# Patient Record
Sex: Female | Born: 1985 | Race: Black or African American | Hispanic: No | Marital: Married | Smoking: Former smoker
Health system: Southern US, Community
[De-identification: ages and names within clinical notes are randomized; demographics above are authoritative.]

## PROBLEM LIST (undated history)

## (undated) DIAGNOSIS — A159 Respiratory tuberculosis unspecified: Secondary | ICD-10-CM

## (undated) DIAGNOSIS — J45909 Unspecified asthma, uncomplicated: Secondary | ICD-10-CM

## (undated) DIAGNOSIS — R112 Nausea with vomiting, unspecified: Secondary | ICD-10-CM

## (undated) DIAGNOSIS — N939 Abnormal uterine and vaginal bleeding, unspecified: Secondary | ICD-10-CM

## (undated) DIAGNOSIS — N390 Urinary tract infection, site not specified: Secondary | ICD-10-CM

## (undated) DIAGNOSIS — E041 Nontoxic single thyroid nodule: Secondary | ICD-10-CM

## (undated) DIAGNOSIS — Z9889 Other specified postprocedural states: Secondary | ICD-10-CM

## (undated) DIAGNOSIS — D649 Anemia, unspecified: Secondary | ICD-10-CM

## (undated) DIAGNOSIS — R0602 Shortness of breath: Secondary | ICD-10-CM

## (undated) DIAGNOSIS — G4733 Obstructive sleep apnea (adult) (pediatric): Secondary | ICD-10-CM

## (undated) DIAGNOSIS — J9801 Acute bronchospasm: Secondary | ICD-10-CM

## (undated) DIAGNOSIS — E66813 Obesity, class 3: Secondary | ICD-10-CM

## (undated) DIAGNOSIS — Z973 Presence of spectacles and contact lenses: Secondary | ICD-10-CM

## (undated) DIAGNOSIS — M75100 Unspecified rotator cuff tear or rupture of unspecified shoulder, not specified as traumatic: Secondary | ICD-10-CM

## (undated) DIAGNOSIS — E785 Hyperlipidemia, unspecified: Secondary | ICD-10-CM

## (undated) DIAGNOSIS — D219 Benign neoplasm of connective and other soft tissue, unspecified: Secondary | ICD-10-CM

## (undated) HISTORY — PX: BREAST LUMPECTOMY: SHX2

## (undated) HISTORY — DX: Benign neoplasm of connective and other soft tissue, unspecified: D21.9

## (undated) HISTORY — DX: Morbid (severe) obesity due to excess calories: E66.01

## (undated) HISTORY — DX: Obesity, class 3: E66.813

## (undated) HISTORY — PX: THERAPEUTIC ABORTION: SHX798

## (undated) HISTORY — PX: OTHER SURGICAL HISTORY: SHX169

---
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
## 2006-06-15 ENCOUNTER — Emergency Department (HOSPITAL_COMMUNITY): Admission: EM | Admit: 2006-06-15 | Discharge: 2006-06-15 | Payer: Self-pay | Admitting: Emergency Medicine

## 2007-02-17 ENCOUNTER — Emergency Department (HOSPITAL_COMMUNITY): Admission: EM | Admit: 2007-02-17 | Discharge: 2007-02-17 | Payer: Self-pay | Admitting: Family Medicine

## 2007-04-06 ENCOUNTER — Inpatient Hospital Stay (HOSPITAL_COMMUNITY): Admission: AD | Admit: 2007-04-06 | Discharge: 2007-04-06 | Payer: Self-pay | Admitting: Gynecology

## 2008-05-02 ENCOUNTER — Emergency Department (HOSPITAL_COMMUNITY): Admission: EM | Admit: 2008-05-02 | Discharge: 2008-05-02 | Payer: Self-pay | Admitting: Emergency Medicine

## 2010-05-11 ENCOUNTER — Inpatient Hospital Stay (HOSPITAL_COMMUNITY): Admission: AD | Admit: 2010-05-11 | Discharge: 2010-05-11 | Payer: Self-pay | Admitting: Obstetrics & Gynecology

## 2010-05-11 ENCOUNTER — Ambulatory Visit: Payer: Self-pay | Admitting: Nurse Practitioner

## 2010-12-28 LAB — CBC
HCT: 26.1 % — ABNORMAL LOW (ref 36.0–46.0)
Hemoglobin: 7.5 g/dL — ABNORMAL LOW (ref 12.0–15.0)
RDW: 19.9 % — ABNORMAL HIGH (ref 11.5–15.5)
WBC: 13.1 10*3/uL — ABNORMAL HIGH (ref 4.0–10.5)

## 2010-12-28 LAB — URINE CULTURE
Colony Count: >=100000
Culture  Setup Time: 201107311302

## 2010-12-28 LAB — DIFFERENTIAL
Basophils Relative: 0 % (ref 0–1)
Eosinophils Absolute: 0.1 10*3/uL (ref 0.0–0.7)
Monocytes Relative: 7 % (ref 3–12)
Neutrophils Relative %: 79 % — ABNORMAL HIGH (ref 43–77)

## 2010-12-28 LAB — COMPREHENSIVE METABOLIC PANEL
ALT: 15 U/L (ref 0–35)
Alkaline Phosphatase: 56 U/L (ref 39–117)
CO2: 25 mEq/L (ref 19–32)
Glucose, Bld: 84 mg/dL (ref 70–99)
Potassium: 3.6 mEq/L (ref 3.5–5.1)
Sodium: 134 mEq/L — ABNORMAL LOW (ref 135–145)
Total Protein: 6.5 g/dL (ref 6.0–8.3)

## 2010-12-28 LAB — URINALYSIS, ROUTINE W REFLEX MICROSCOPIC
Bilirubin Urine: NEGATIVE
Ketones, ur: NEGATIVE mg/dL
Protein, ur: 30 mg/dL — AB
Urobilinogen, UA: 0.2 mg/dL (ref 0.0–1.0)

## 2010-12-28 LAB — POCT PREGNANCY, URINE: Preg Test, Ur: NEGATIVE

## 2010-12-28 LAB — GC/CHLAMYDIA PROBE AMP, GENITAL: Chlamydia, DNA Probe: NEGATIVE

## 2010-12-28 LAB — WET PREP, GENITAL

## 2011-07-11 LAB — URINALYSIS, ROUTINE W REFLEX MICROSCOPIC
Glucose, UA: NEGATIVE
Specific Gravity, Urine: 1.022
pH: 5.5

## 2011-07-11 LAB — PREGNANCY, URINE: Preg Test, Ur: NEGATIVE

## 2011-07-30 LAB — URINALYSIS, ROUTINE W REFLEX MICROSCOPIC
Ketones, ur: NEGATIVE
Protein, ur: NEGATIVE
Urobilinogen, UA: 1

## 2011-07-30 LAB — WET PREP, GENITAL: Trich, Wet Prep: NONE SEEN

## 2011-07-30 LAB — GC/CHLAMYDIA PROBE AMP, GENITAL: Chlamydia, DNA Probe: NEGATIVE

## 2011-07-30 LAB — URINE MICROSCOPIC-ADD ON

## 2011-07-30 LAB — POCT PREGNANCY, URINE: Operator id: 120561

## 2011-10-14 DIAGNOSIS — D219 Benign neoplasm of connective and other soft tissue, unspecified: Secondary | ICD-10-CM

## 2011-10-14 HISTORY — DX: Benign neoplasm of connective and other soft tissue, unspecified: D21.9

## 2011-12-20 ENCOUNTER — Encounter (HOSPITAL_COMMUNITY): Payer: Self-pay

## 2011-12-20 ENCOUNTER — Inpatient Hospital Stay (HOSPITAL_COMMUNITY)
Admission: AD | Admit: 2011-12-20 | Discharge: 2011-12-21 | Disposition: A | Discharge status: Home Health | Payer: Self-pay | Source: Ambulatory Visit | Attending: Obstetrics & Gynecology | Admitting: Obstetrics & Gynecology

## 2011-12-20 ENCOUNTER — Inpatient Hospital Stay (HOSPITAL_COMMUNITY): Payer: Self-pay

## 2011-12-20 DIAGNOSIS — N912 Amenorrhea, unspecified: Secondary | ICD-10-CM | POA: Insufficient documentation

## 2011-12-20 DIAGNOSIS — R109 Unspecified abdominal pain: Secondary | ICD-10-CM | POA: Insufficient documentation

## 2011-12-20 DIAGNOSIS — D259 Leiomyoma of uterus, unspecified: Secondary | ICD-10-CM

## 2011-12-20 DIAGNOSIS — D649 Anemia, unspecified: Secondary | ICD-10-CM

## 2011-12-20 DIAGNOSIS — M549 Dorsalgia, unspecified: Secondary | ICD-10-CM | POA: Insufficient documentation

## 2011-12-20 DIAGNOSIS — D219 Benign neoplasm of connective and other soft tissue, unspecified: Secondary | ICD-10-CM

## 2011-12-20 HISTORY — DX: Acute bronchospasm: J98.01

## 2011-12-20 LAB — URINALYSIS, ROUTINE W REFLEX MICROSCOPIC
Glucose, UA: NEGATIVE mg/dL
Ketones, ur: 15 mg/dL — AB
Leukocytes, UA: NEGATIVE
Protein, ur: 30 mg/dL — AB

## 2011-12-20 LAB — URINE MICROSCOPIC-ADD ON

## 2011-12-20 LAB — CBC
HCT: 25.7 % — ABNORMAL LOW (ref 36.0–46.0)
Hemoglobin: 6.3 g/dL — CL (ref 12.0–15.0)
MCH: 14.9 pg — ABNORMAL LOW (ref 26.0–34.0)
MCHC: 24.5 g/dL — ABNORMAL LOW (ref 30.0–36.0)
RDW: 21 % — ABNORMAL HIGH (ref 11.5–15.5)

## 2011-12-20 LAB — WET PREP, GENITAL

## 2011-12-20 MED ORDER — HYDROCODONE-ACETAMINOPHEN 5-500 MG PO TABS: 1.0000 | ORAL_TABLET | Freq: Four times a day (QID) | ORAL | Status: AC | PRN

## 2011-12-20 MED ORDER — FERROUS SULFATE 325 (65 FE) MG PO TABS: 325.0000 mg | ORAL_TABLET | Freq: Three times a day (TID) | ORAL | Status: DC

## 2011-12-20 NOTE — Discharge Instructions (Signed)
Anemia, Frequently Asked Questions WHAT ARE THE SYMPTOMS OF ANEMIA?  Headache.   Difficulty thinking.   Fatigue.   Shortness of breath.   Weakness.   Rapid heartbeat.  AT WHAT POINT ARE PEOPLE CONSIDERED ANEMIC?  This varies with gender and age.   Both hemoglobin (Hgb) and hematocrit values are used to define anemia. These lab values are obtained from a complete blood count (CBC) test. This is performed at a caregiver's office.   The normal range of hemoglobin values for adult men is 14.0 g/dL to 17.4 g/dL. For nonpregnant women, values are 12.3 g/dL to 15.3 g/dL.   The World Health Organization defines anemia as less than 12 g/dL for nonpregnant women and less than 13 g/dL for men.   For adult males, the average normal hematocrit is 46%, and the range is 40% to 52%.   For adult females, the average normal hematocrit is 41%, and the range is 35% to 47%.   Values that fall below the lower limits can be a sign of anemia and should have further checking (evaluation).  GROUPS OF PEOPLE WHO ARE AT RISK FOR DEVELOPING ANEMIA INCLUDE:   Infants who are breastfed or taking a formula that is not fortified with iron.   Children going through a rapid growth spurt. The iron available can not keep up with the needs for a red cell mass which must grow with the child.   Women in childbearing years. They need iron because of blood loss during menstruation.   Pregnant women. The growing fetus creates a high demand for iron.   People with ongoing gastrointestinal blood loss are at risk of developing iron deficiency.   Individuals with leukemia or cancer who must receive chemotherapy or radiation to treat their disease. The drugs or radiation used to treat these diseases often decreases the bone marrow's ability to make cells of all classes. This includes red blood cells, white blood cells, and platelets.   Individuals with chronic inflammatory conditions such as rheumatoid arthritis or  chronic infections.   The elderly.  ARE SOME TYPES OF ANEMIA INHERITED?   Yes, some types of anemia are due to inherited or genetic defects.   Sickle cell anemia. This occurs most often in people of African, African American, and Mediterranean descent.   Thalassemia (or Cooley's anemia). This type is found in people of Mediterranean and Southeast Asian descent. These types of anemia are common.   [place]. This is rare.  CAN CERTAIN MEDICATIONS CAUSE A PERSON TO BECOME ANEMIC?  Yes. For example, drugs to fight cancer (chemotherapeutic agents) often cause anemia. These drugs can slow the bone marrow's ability to make red blood cells. If there are not enough red blood cells, the body does not get enough oxygen. WHAT HEMATOCRIT LEVEL IS REQUIRED TO DONATE BLOOD?  The lower limit of an acceptable hematocrit for blood donors is 38%. If you have a low hematocrit value, you should schedule an appointment with your caregiver. ARE BLOOD TRANSFUSIONS COMMONLY USED TO CORRECT ANEMIA, AND ARE THEY DANGEROUS?  They are used to treat anemia as a last resort. Your caregiver will find the cause of the anemia and correct it if possible. Most blood transfusions are given because of excessive bleeding at the time of surgery, with trauma, or because of bone marrow suppression in patients with cancer or leukemia on chemotherapy. Blood transfusions are safer than ever before. We also know that blood transfusions affect the immune system and may increase certain risks. There is   also a concern for human error. In 1/16,000 transfusions, a patient receives a transfusion of blood that is not matched with his or her blood type.  WHAT IS IRON DEFICIENCY ANEMIA AND CAN I CORRECT IT BY CHANGING MY DIET?  Iron is an essential part of hemoglobin. Without enough hemoglobin, anemia develops and the body does not get the right amount of oxygen. Iron deficiency anemia develops after the body has had a low level of iron for a long  time. This is either caused by blood loss, not taking in or absorbing enough iron, or increased demands for iron (like pregnancy or rapid growth).  Foods from animal origin such as beef, chicken, and pork, are good sources of iron. Be sure to have one of these foods at each meal. Vitamin C helps your body absorb iron. Foods rich in Vitamin C include citrus, bell pepper, strawberries, spinach and cantaloupe. In some cases, iron supplements may be needed in order to correct the iron deficiency. In the case of poor absorption, extra iron may have to be given directly into the vein through a needle (intravenously). I HAVE BEEN DIAGNOSED WITH IRON DEFICIENCY ANEMIA AND MY CAREGIVER PRESCRIBED IRON SUPPLEMENTS. HOW LONG WILL IT TAKE FOR MY BLOOD TO BECOME NORMAL?  It depends on the degree of anemia at the beginning of treatment. Most people with mild to moderate iron deficiency, anemia will correct the anemia over a period of 2 to 3 months. But after the anemia is corrected, the iron stored by the body is still low. Caregivers often suggest an additional 6 months of oral iron therapy once the anemia has been reversed. This will help prevent the iron deficiency anemia from quickly happening again. Non-anemic adult males should take iron supplements only under the direction of a doctor, too much iron can cause liver damage.  MY HEMOGLOBIN IS 9 G/DL AND I AM SCHEDULED FOR SURGERY. SHOULD I POSTPONE THE SURGERY?  If you have Hgb of 9, you should discuss this with your caregiver right away. Many patients with similar hemoglobin levels have had surgery without problems. If minimal blood loss is expected for a minor procedure, no treatment may be necessary.  If a greater blood loss is expected for more extensive procedures, you should ask your caregiver about being treated with erythropoietin and iron. This is to accelerate the recovery of your hemoglobin to a normal level before surgery. An anemic patient who undergoes  high-blood-loss surgery has a greater risk of surgical complications and need for a blood transfusion, which also carries some risk.  I HAVE BEEN TOLD THAT HEAVY MENSTRUAL PERIODS CAUSE ANEMIA. IS THERE ANYTHING I CAN DO TO PREVENT THE ANEMIA?  Anemia that results from heavy periods is usually due to iron deficiency. You can try to meet the increased demands for iron caused by the heavy monthly blood loss by increasing the intake of iron-rich foods. Iron supplements may be required. Discuss your concerns with your caregiver. WHAT CAUSES ANEMIA DURING PREGNANCY?  Pregnancy places major demands on the body. The mother must meet the needs of both her body and her growing baby. The body needs enough iron and folate to make the right amount of red blood cells. To prevent anemia while pregnant, the mother should stay in close contact with her caregiver.  Be sure to eat a diet that has foods rich in iron and folate like liver and dark green leafy vegetables. Folate plays an important role in the normal development of a baby's   spinal cord. Folate can help prevent serious disorders like spina bifida. If your diet does not provide adequate nutrients, you may want to talk with your caregiver about nutritional supplements.  WHAT IS THE RELATIONSHIP BETWEEN FIBROID TUMORS AND ANEMIA IN WOMEN?  The relationship is usually caused by the increased menstrual blood loss caused by fibroids. Good iron intake may be required to prevent iron deficiency anemia from developing.  Document Released: 05/07/2004 Document Revised: 09/18/2011 Document Reviewed: 10/22/2010 [facility] Patient Information 2012 [place], [place].Fibroids You have been diagnosed as having a fibroid. Fibroids are smooth muscle lumps (tumors) which can occur any place in a woman's body. They are usually in the womb (uterus). The most common problem (symptom) of fibroids is bleeding. Over time this may cause low red blood cells (anemia). Other symptoms include  feelings of pressure and pain in the pelvis. The diagnosis (learning what is wrong) of fibroids is made by physical exam. Sometimes tests such as an ultrasound are used. This is helpful when fibroids are felt around the ovaries and to look for tumors. TREATMENT   Most fibroids do not need surgical or medical treatment. Sometimes a tissue sample (biopsy) of the lining of the uterus is done to rule out cancer. If there is no cancer and only a small amount of bleeding, the problem can be watched.   Hormonal treatment can improve the problem.   When surgery is needed, it can consist of removing the fibroid. Vaginal birth may not be possible after the removal of fibroids. This depends on where they are and the extent of surgery. When pregnancy occurs with fibroids it is usually normal.   Your caregiver can help decide which treatments are best for you.  HOME CARE INSTRUCTIONS   Do not use aspirin as this may increase bleeding problems.   If your periods (menses) are heavy, record the number of pads or tampons used per month. Bring this information to your caregiver. This can help them determine the best treatment for you.  SEEK IMMEDIATE MEDICAL CARE IF:  You have pelvic pain or cramps not controlled with medications, or experience a sudden increase in pain.   You have an increase of pelvic bleeding between and during menses.   You feel lightheaded or have fainting spells.   You develop worsening belly (abdominal) pain.  Document Released: 09/26/2000 Document Revised: 09/18/2011 Document Reviewed: 05/18/2008 [facility] Patient Information 2012 [place], [place].

## 2011-12-20 NOTE — ED Provider Notes (Signed)
History   Pt presents today c/o vag dc and abd pain for the past 3 wks. She also states she did not have menses in Jan. She denies dysuria, vag irritation, vag bleeding, diarrhea. She has multiple other non-specific complaints including occ nausea, weakness, and pains that come and go.  Chief Complaint  Patient presents with  . Back Pain  . Amenorrhea  . Abdominal Pain   HPI  OB History    Grav Para Term Preterm Abortions TAB SAB Ect Mult Living   1    1 1           Past Medical History  Diagnosis Date  . Spasm of lung air passages     Past Surgical History  Procedure Date  . Left arm surgey     History reviewed. No pertinent family history.  History  Substance Use Topics  . Smoking status: Current Some Day Smoker -- 0.2 packs/day    Types: Cigarettes  . Smokeless tobacco: Not on file  . Alcohol Use: No    Allergies: Allergies not on file  No prescriptions prior to admission    Review of Systems  Constitutional: Positive for malaise/fatigue. Negative for fever and chills.  Eyes: Negative for blurred vision and double vision.  Respiratory: Negative for cough, hemoptysis, sputum production, shortness of breath and wheezing.   Cardiovascular: Negative for chest pain and palpitations.  Gastrointestinal: Positive for nausea and abdominal pain. Negative for vomiting, diarrhea and constipation.  Genitourinary: Negative for dysuria, urgency, frequency, hematuria and flank pain.  Musculoskeletal: Positive for myalgias.  Skin: Negative for itching and rash.  Neurological: Positive for dizziness and weakness. Negative for headaches.  Psychiatric/Behavioral: Negative for depression and suicidal ideas.   Physical Exam   Blood pressure 134/70, pulse 108, temperature 100.5 F (38.1 C), temperature source Oral, resp. rate 20, height 5\' 8"  (1.727 m), weight 256 lb 6 oz (116.291 kg), last menstrual period 11/04/2011, SpO2 100.00%.  Physical Exam  Nursing note and vitals  reviewed. Constitutional: She is oriented to person, place, and time. She appears well-developed and well-nourished. No distress.  HENT:  Head: Normocephalic and atraumatic.  Eyes: EOM are normal. Pupils are equal, round, and reactive to light.  GI: Soft. She exhibits no distension and no mass. There is no tenderness. There is no rebound and no guarding.  Genitourinary: No bleeding around the vagina. Vaginal discharge found.       Uterus is enlarged but difficult to palpate secondary to increased body habitus. Thin, creamy vag dc present. Cervix Lg/closed and appears stenotic.  Neurological: She is alert and oriented to person, place, and time.  Skin: Skin is warm and dry. She is not diaphoretic.  Psychiatric: She has a normal mood and affect. Her behavior is normal. Judgment and thought content normal.    MAU Course  Procedures  Wet prep and GC/Chlamydia cultures done.  Results for orders placed during the hospital encounter of 12/20/11 (from the past 72 hour(s))  URINALYSIS, ROUTINE W REFLEX MICROSCOPIC     Status: Abnormal   Collection Time   12/20/11  9:00 PM      Component Value Range Comment   Color, Urine YELLOW  YELLOW     APPearance CLEAR  CLEAR     Specific Gravity, Urine >1.030 (*) 1.005 - 1.030     pH 6.0  5.0 - 8.0     Glucose, UA NEGATIVE  NEGATIVE (mg/dL)    Hgb urine dipstick NEGATIVE  NEGATIVE  Bilirubin Urine SMALL (*) NEGATIVE     Ketones, ur 15 (*) NEGATIVE (mg/dL)    Protein, ur 30 (*) NEGATIVE (mg/dL)    Urobilinogen, UA 0.2  0.0 - 1.0 (mg/dL)    Nitrite NEGATIVE  NEGATIVE     Leukocytes, UA NEGATIVE  NEGATIVE    URINE MICROSCOPIC-ADD ON     Status: Abnormal   Collection Time   12/20/11  9:00 PM      Component Value Range Comment   Squamous Epithelial / LPF FEW (*) RARE     WBC, UA 0-2  <3 (WBC/hpf)    RBC / HPF 3-6  <3 (RBC/hpf)    Bacteria, UA MANY (*) RARE    POCT PREGNANCY, URINE     Status: Normal   Collection Time   12/20/11  9:03 PM       Component Value Range Comment   Preg Test, Ur NEGATIVE  NEGATIVE    WET PREP, GENITAL     Status: Abnormal   Collection Time   12/20/11  9:15 PM      Component Value Range Comment   Yeast Wet Prep HPF POC NONE SEEN  NONE SEEN     Trich, Wet Prep NONE SEEN  NONE SEEN     Clue Cells Wet Prep HPF POC MODERATE (*) NONE SEEN     WBC, Wet Prep HPF POC FEW (*) NONE SEEN  MANY BACTERIA SEEN  CBC     Status: Abnormal   Collection Time   12/20/11  9:21 PM      Component Value Range Comment   WBC 7.7  4.0 - 10.5 (K/uL)    RBC 4.24  3.87 - 5.11 (MIL/uL)    Hemoglobin 6.3 (*) 12.0 - 15.0 (g/dL)    HCT 16.1 (*) 09.6 - 46.0 (%)    MCV 60.6 (*) 78.0 - 100.0 (fL)    MCH 14.9 (*) 26.0 - 34.0 (pg)    MCHC 24.5 (*) 30.0 - 36.0 (g/dL)    RDW 04.5 (*) 40.9 - 15.5 (%)    Platelets 411 (*) 150 - 400 (K/uL)     US Transvaginal Non-ob  12/20/2011  *RADIOLOGY REPORT*  Clinical Data: Enlarged uterus, pain.  TRANSABDOMINAL ULTRASOUND OF PELVIS  Technique:  Transabdominal and transvaginal ultrasound examination of the pelvis was performed including evaluation of the uterus, ovaries, adnexal regions, and pelvic cul-de-sac.  Comparison:  None.  Findings:  Uterus:  Measures measures 9.2 x 5.5 x 6.8 cm.  There is an intramural fibroid, measures 5.0 x 3.8 x 4.3 cm, partially effacing the endometrial stripe.  Endometrium: Measures up to 12 mm in thickness with two small cystic foci noted.  Right ovary: Measures 3.3 x 2.2 x 2.7 cm.  Normal sonographic appearance.  Left ovary: Measures 3.1 x 2.1 x 2.8 cm.  Normal sonographic appearance.  Other Findings:  Small amount of free fluid.  IMPRESSION: 5 cm intramural fibroid that does abut the endometrium.  Original Report Authenticated By: Waneta Martins, M.D.   US Pelvis Complete  12/20/2011  *RADIOLOGY REPORT*  Clinical Data: Enlarged uterus, pain.  TRANSABDOMINAL ULTRASOUND OF PELVIS  Technique:  Transabdominal and transvaginal ultrasound examination of the pelvis was performed  including evaluation of the uterus, ovaries, adnexal regions, and pelvic cul-de-sac.  Comparison:  None.  Findings:  Uterus:  Measures measures 9.2 x 5.5 x 6.8 cm.  There is an intramural fibroid, measures 5.0 x 3.8 x 4.3 cm, partially effacing the endometrial stripe.  Endometrium: Measures up  to 12 mm in thickness with two small cystic foci noted.  Right ovary: Measures 3.3 x 2.2 x 2.7 cm.  Normal sonographic appearance.  Left ovary: Measures 3.1 x 2.1 x 2.8 cm.  Normal sonographic appearance.  Other Findings:  Small amount of free fluid.  IMPRESSION: 5 cm intramural fibroid that does abut the endometrium.  Original Report Authenticated By: Waneta Martins, M.D.   Discussed pt with Dr. Macon Large. Need to offer blood transfusion. Assessment and Plan  Anemia: discussed with pt at length. Recommended transfusion secondary to severe anemia. Pt discussed options with her mother who is present and they have elected to try iron supplementation first. She understands that if her sx worsen or she develops other sx she needs to return immediately to the hospital. Discussed diet, activity, risks, and precautions. Will place pt on ferrous sulfate 325mg  tid.  Uterine fibroids: discussed with pt at length. She will f/u in the GYN clinic to discuss tx options. The GYN clinic will contact her for an appt.  Clinton Gallant. Navjot Loera III, DrHSc, MPAS, PA-C  12/20/2011, 9:17 PM   Harmon Pier, PA 12/20/11 [id number]

## 2011-12-20 NOTE — Progress Notes (Signed)
Patient is in with c/o lower back pain, denies dysuria, upper and lower abdominal pain and missed last month period. She states that she has been nauseated for 2 weeks now. She denies any vaginal bleeding or discharge.

## 2011-12-20 NOTE — Progress Notes (Signed)
CRITICAL VALUE ALERT  Critical value received:  hgb 6.3     Date of notification:  12/20/2011  Time of notification:  21:40  Critical value read back: yes  Nurse who received alert:  Seraya Jobst Gordan Payment RN bsn  MD notified (1st page):  Henrietta Hoover, pa  Time of first page:   MD notified (2nd page):  Time of second page:  Responding MD:    Time MD responded:

## 2011-12-20 NOTE — ED Provider Notes (Signed)
Attestation of Attending Supervision of Advanced Practitioner: Evaluation and management procedures were performed by the PA/NP/CNM/OB Fellow under my supervision/collaboration. Chart reviewed, and agree with management and plan.  Patient declined transfusion for treatment of symptomatic anemia. Precautions advised.  Jaynie Collins, M.D. 12/20/2011 11:08 PM

## 2012-01-30 ENCOUNTER — Encounter: Payer: Self-pay | Admitting: Obstetrics & Gynecology

## 2012-01-30 ENCOUNTER — Ambulatory Visit (INDEPENDENT_AMBULATORY_CARE_PROVIDER_SITE_OTHER): Payer: 59 | Admitting: Obstetrics & Gynecology

## 2012-01-30 VITALS — BP 114/86 | HR 77 | Temp 99.5°F | Ht 67.5 in | Wt 259.3 lb

## 2012-01-30 DIAGNOSIS — N920 Excessive and frequent menstruation with regular cycle: Secondary | ICD-10-CM

## 2012-01-30 DIAGNOSIS — D649 Anemia, unspecified: Secondary | ICD-10-CM

## 2012-01-30 DIAGNOSIS — N926 Irregular menstruation, unspecified: Secondary | ICD-10-CM

## 2012-01-30 DIAGNOSIS — D219 Benign neoplasm of connective and other soft tissue, unspecified: Secondary | ICD-10-CM

## 2012-01-30 DIAGNOSIS — Z0000 Encounter for general adult medical examination without abnormal findings: Secondary | ICD-10-CM

## 2012-01-30 DIAGNOSIS — D259 Leiomyoma of uterus, unspecified: Secondary | ICD-10-CM

## 2012-01-30 LAB — POCT PREGNANCY, URINE: Preg Test, Ur: NEGATIVE

## 2012-01-30 MED ORDER — MISOPROSTOL 200 MCG PO TABS: ORAL_TABLET | ORAL | Status: DC

## 2012-01-30 MED ORDER — OXYCODONE-ACETAMINOPHEN 5-325 MG PO TABS: 1.0000 | ORAL_TABLET | Freq: Four times a day (QID) | ORAL | Status: DC | PRN

## 2012-01-30 MED ORDER — IBUPROFEN 800 MG PO TABS: 800.0000 mg | ORAL_TABLET | Freq: Three times a day (TID) | ORAL | Status: AC | PRN

## 2012-01-30 NOTE — Progress Notes (Signed)
Addended by: Allie Bossier on: 01/30/2012 10:54 AM   Modules accepted: Orders

## 2012-01-30 NOTE — Progress Notes (Signed)
C/o since been to MAU feels a little better but still having feeling of lightheadedness, nausea, no appetite, dizziness at times. Ran out of iron for about a week, but taking again, c/o pain under left breast/ribs radiating to back . Also c/o feels heart racing at times and sob even when not doing anything.

## 2012-01-30 NOTE — Progress Notes (Signed)
  Subjective:    Patient ID: Yvonne [name], female    DOB: 27-Dec-1985, 27 y.o.   MRN: [id number]  HPI Yvonne [name] is here for follow up of menorrhagia for which she was evaluated in the MAU. She also complains of LUQ pain. Her HBG was 6.3 in the MAU and she refused blood at that time. A 5 cm intramural fibroid was noted on u/s.   Review of Systems    She has never been tested for sickle cell disease. She is chronicaly tired and cold. Objective:   Physical Exam  Nulliparous cervix 10 week size mobile uterus NT, non-enlarged       Assessment & Plan:  Menorrhagia presumably due to fibroid. I will check a TSH and screen for sickle cell disease. I will try to coordinate with Dr. April Manson for a robotic assisted myomectomy. She agrees to come in this Thursday for a transfusion. I did a pap smear today.

## 2012-02-02 ENCOUNTER — Telehealth: Payer: Self-pay | Admitting: *Deleted

## 2012-02-02 NOTE — Telephone Encounter (Signed)
Received a voicemail from [employer] stating she had received a call from the patient.  States patient told her the pharmacy told her she had 3 prescriptions for pick-up and she only knew about 2.  States one of the prescriptions is for cytotec.  I have a call in to Dr. Marice Potter for clarification.  Then I will contact the patient.

## 2012-02-03 NOTE — Telephone Encounter (Signed)
Spoke with Dr. Marice Potter this morning.  States she discontinued cytotec.  Patient doesn't need.  Called patient and left a message to please call me that I was returning her call from yesterday.

## 2012-02-05 ENCOUNTER — Encounter (HOSPITAL_COMMUNITY): Payer: Self-pay | Admitting: *Deleted

## 2012-02-05 ENCOUNTER — Observation Stay (HOSPITAL_COMMUNITY)
Admission: AD | Admit: 2012-02-05 | Discharge: 2012-02-05 | DRG: 812 | Disposition: A | Discharge status: Home / Self Care | Payer: 59 | Source: Ambulatory Visit | Attending: Obstetrics & Gynecology | Admitting: Obstetrics & Gynecology

## 2012-02-05 DIAGNOSIS — N920 Excessive and frequent menstruation with regular cycle: Secondary | ICD-10-CM | POA: Diagnosis not present

## 2012-02-05 DIAGNOSIS — D500 Iron deficiency anemia secondary to blood loss (chronic): Principal | ICD-10-CM | POA: Diagnosis present

## 2012-02-05 DIAGNOSIS — R768 Other specified abnormal immunological findings in serum: Secondary | ICD-10-CM | POA: Insufficient documentation

## 2012-02-05 DIAGNOSIS — D219 Benign neoplasm of connective and other soft tissue, unspecified: Secondary | ICD-10-CM | POA: Diagnosis present

## 2012-02-05 DIAGNOSIS — D259 Leiomyoma of uterus, unspecified: Secondary | ICD-10-CM

## 2012-02-05 DIAGNOSIS — D251 Intramural leiomyoma of uterus: Secondary | ICD-10-CM | POA: Diagnosis present

## 2012-02-05 LAB — CBC
Hemoglobin: 10.2 g/dL — ABNORMAL LOW (ref 12.0–15.0)
MCHC: 29.2 g/dL — ABNORMAL LOW (ref 30.0–36.0)
Platelets: 233 10*3/uL (ref 150–400)
RDW: 24.7 % — ABNORMAL HIGH (ref 11.5–15.5)

## 2012-02-05 MED ORDER — OXYCODONE-ACETAMINOPHEN 5-325 MG PO TABS: 1.0000 | ORAL_TABLET | Freq: Four times a day (QID) | ORAL | Status: DC | PRN

## 2012-02-05 MED ORDER — ONDANSETRON HCL 4 MG/2ML IJ SOLN: 4.0000 mg | Freq: Four times a day (QID) | INTRAMUSCULAR | Status: DC | PRN

## 2012-02-05 MED ORDER — ALUM & MAG HYDROXIDE-SIMETH 200-200-20 MG/5ML PO SUSP: 30.0000 mL | ORAL | Status: DC | PRN

## 2012-02-05 MED ORDER — ACETAMINOPHEN 500 MG PO TABS: 1000.0000 mg | ORAL_TABLET | Freq: Four times a day (QID) | ORAL | Status: DC | PRN

## 2012-02-05 MED ORDER — SODIUM CHLORIDE 0.9 % IV SOLN: INTRAVENOUS | Status: DC

## 2012-02-05 MED ORDER — ONDANSETRON HCL 4 MG PO TABS: 4.0000 mg | ORAL_TABLET | Freq: Four times a day (QID) | ORAL | Status: DC | PRN

## 2012-02-05 MED ORDER — DIPHENHYDRAMINE HCL 25 MG PO CAPS: 25.0000 mg | ORAL_CAPSULE | Freq: Once | ORAL | Status: DC

## 2012-02-05 MED ORDER — LACTATED RINGERS IV BOLUS (SEPSIS)
1000.0000 mL | Freq: Once | INTRAVENOUS | Status: AC
  Administered 2012-02-05: 1000 mL via INTRAVENOUS

## 2012-02-05 MED ORDER — FUROSEMIDE 10 MG/ML IJ SOLN
20.0000 mg | Freq: Once | INTRAMUSCULAR | Status: DC
  Filled 2012-02-05: qty 2

## 2012-02-05 MED ORDER — IBUPROFEN 800 MG PO TABS: 800.0000 mg | ORAL_TABLET | Freq: Three times a day (TID) | ORAL | Status: DC | PRN

## 2012-02-05 MED ORDER — LACTATED RINGERS IV SOLN: INTRAVENOUS | Status: DC

## 2012-02-05 MED ORDER — ACETAMINOPHEN 500 MG PO TABS: 1000.0000 mg | ORAL_TABLET | Freq: Once | ORAL | Status: DC

## 2012-02-05 MED ORDER — FERROUS SULFATE 325 (65 FE) MG PO TABS: 325.0000 mg | ORAL_TABLET | Freq: Three times a day (TID) | ORAL | Status: DC

## 2012-02-05 NOTE — Progress Notes (Signed)
Pt d/c home with family, ambulatory, to private car. Instructed pt to return to the clinic on Monday 4/29 @ 1100 for blood work. Pt verbalized understanding.

## 2012-02-05 NOTE — Discharge Summary (Signed)
Physician Discharge Summary  Patient ID: Yvonne [name] MRN: [id number] DOB/AGE: 1986-02-27 26 y.o.  Admit date: 02/05/2012 Discharge date: 02/05/2012  Admission Diagnoses: Symptomatic anemia, menorrhagia, fibroid  Discharge Diagnoses:  Principal Problem:  *Symptomatic anemia due to menorrhagia Active Problems:  Fibroids  Menorrhagia  Discharged Condition: stable  Hospital Course: 26 yo G1P0010 with known menorrhagia, 5 cm intramural fibroid.  She was seen in the MAU on 12/20/11 and her hemoglobin was 6.3.  She refused blood at that time.  She was seen by Dr. Marice [name] in clinic on 01/30/12 and agreed to come in for transfusion today. She reports minimal bleeding at this time, just finishing her menstrual period. Reports occasional lightheadedness, dizziness but denies any other concerns. On admission, her hemoglobin was 10.2 which was surprising.  Blood bank called and reported that the patient's Type and Screen showed an unknown antibody; she will need further blood samples sent to the [facility] for confirmation of antibody type. As a result, no blood was available to be given to the patient; the earliest any blood could be available was 02/06/12. Patient was unwilling to wait, she has a wedding to attend tomorrow. She felt she was up to going to the wedding, denied feeling pre-syncopal.  I recommended she receive one liter of IV fluids which may help her occasional lightheadedness and dizziness; but also recommended she stay for the transfusion because of these symptoms. Patient wanted to go home after the IV fluids, wanted to come back on Monday 02/09/12 for re-evaluation in the clinic and repeat CBC. Appointment scheduled in the clinic for 1100 on 02/09/12. Patient was discharged to home after receiving IV fluids. Anemia and bleeding precautions reviewed.  Consults: None  Significant Diagnostic Studies:  Lab Results  Component Value Date   WBC 7.6 02/05/2012   HGB 10.2* 02/05/2012   HCT 34.9*  02/05/2012   MCV 81.0 02/05/2012   PLT 233 02/05/2012   Treatments: IV hydration  Discharge Exam: Blood pressure 117/74, pulse 69, temperature 98.3 F (36.8 C), temperature source Oral, resp. rate 20, height 5' 7.5" (1.715 m), weight 117.482 kg (259 lb), last menstrual period 01/24/2012, SpO2 95.00%. General appearance: alert and no distress Resp: clear to auscultation bilaterally Cardio: regular rate and rhythm Pelvic: deferred Extremities: extremities normal, atraumatic, no cyanosis or edema  Disposition: Home  Discharge Orders    Future Appointments: Provider: Department: Dept Phone: Center:   02/09/2012 11:00 AM [facility] [phone] [facility]     Medication List  As of 02/05/2012  3:56 PM   STOP taking these medications         ibuprofen 200 MG tablet         TAKE these medications         ferrous sulfate 325 (65 FE) MG tablet   Take 1 tablet (325 mg total) by mouth 3 (three) times daily with meals.      ibuprofen 800 MG tablet   Commonly known as: ADVIL,MOTRIN   Take 1 tablet (800 mg total) by mouth every 8 (eight) hours as needed for pain.      oxyCODONE-acetaminophen 5-325 MG per tablet   Commonly known as: PERCOCET   Take 1 tablet by mouth every 6 (six) hours as needed for pain.           Follow-up Information    Follow up with [facility] on 02/09/2012. (11 am for CBC check.  Come to MAU if symptoms worsen)  Contact information:   [address] [place] [address]          Signed: Tereso Newcomer 02/05/2012, 3:56 PM

## 2012-02-05 NOTE — H&P (Signed)
Gynecology Admission History and Physical  Yvonne [name] is a 26 y.o. G1P0010 here for management of symptomatic anemia.  She has menorrhagia for which she was evaluated in the MAU. Her HBG was 6.3 in the MAU and she refused blood at that time. A 5 cm intramural fibroid was noted on u/s.  She was seen by Dr. Marice Potter in clinic on 01/30/12 and agreed to come in for transfusion today.  She reports minimal bleeding at this time, just finishing her menstrual period.  Reports occasional lightheadedness, dizziness but denies any other concerns.  PMH:    Past Medical History  Diagnosis Date  . Spasm of lung air passages   . Fibroids   . Obesity, Class III, BMI 40-49.9 (morbid obesity)     BMI 40   PSH:     Past Surgical History  Procedure Date  . Left arm surgey   . Therapeutic abortion    POb/GynH:   OB History    Grav Para Term Preterm Abortions TAB SAB Ect Mult Living   1    1 1         Patient denies any cervical dysplasia or STIs.  Medications: Current facility-administered medications:acetaminophen (TYLENOL) tablet 1,000 mg, 1,000 mg, Oral, Once, Michaela Broski A Zamari Vea, MD;  acetaminophen (TYLENOL) tablet 1,000 mg, 1,000 mg, Oral, Q6H PRN, Tereso Newcomer, MD;  alum & mag hydroxide-simeth (MAALOX/MYLANTA) 200-200-20 MG/5ML suspension 30 mL, 30 mL, Oral, Q4H PRN, Tereso Newcomer, MD;  diphenhydrAMINE (BENADRYL) capsule 25 mg, 25 mg, Oral, Once, Jamerson Vonbargen A Wasim Hurlbut, MD ferrous sulfate tablet 325 mg, 325 mg, Oral, TID WC, Caileigh Canche A Josey Forcier, MD;  furosemide (LASIX) injection 20 mg, 20 mg, Intravenous, Once, Johnn Krasowski A Haydan Wedig, MD;  ibuprofen (ADVIL,MOTRIN) tablet 800 mg, 800 mg, Oral, Q8H PRN, Noland Pizano A Tammela Bales, MD;  lactated ringers infusion, , Intravenous, Continuous, Niang Mitcheltree A Carlotta Telfair, MD;  ondansetron (ZOFRAN) injection 4 mg, 4 mg, Intravenous, Q6H PRN, Orpha Dain A Daeveon Zweber, MD ondansetron (ZOFRAN) tablet 4 mg, 4 mg, Oral, Q6H PRN, Zamara Cozad A Kelvis Berger, MD;  oxyCODONE-acetaminophen (PERCOCET) 5-325 MG per tablet  1-2 tablet, 1-2 tablet, Oral, Q6H PRN, Tereso Newcomer, MD  Allergies: No Known Allergies  SH:   History  Substance Use Topics  . Smoking status: Former Smoker -- 0.2 packs/day    Types: Cigarettes  . Smokeless tobacco: Never Used  . Alcohol Use: Yes     occasionally   FH:    Family History  Problem Relation Age of Onset  . Fibroids Mother    Review of Systems: Noncontributory  PHYSICAL EXAM: Blood pressure 134/85, pulse 68, temperature 98.6 F (37 C), temperature source Oral, resp. rate 20, last menstrual period 01/02/2012, SpO2 99.00%. General appearance - alert, well appearing, and in no distress Chest - clear to auscultation, no wheezes, rales or rhonchi, symmetric air entry Heart - normal rate and regular rhythm Abdomen - soft, obese, nontender, nondistended, no masses or organomegaly Pelvic - examination not indicated Extremities - peripheral pulses normal, no pedal edema, no clubbing or cyanosis  Labs: Recent Results (from the past 336 hour(s))  POCT PREGNANCY, URINE   Collection Time   01/30/12 10:17 AM      Component Value Range   Preg Test, Ur NEGATIVE  NEGATIVE   TSH   Collection Time   01/30/12 10:46 AM      Component Value Range   TSH 1.914  0.350 - 4.500 (uIU/mL)  SICKLE CELL SCREEN   Collection Time   01/30/12 10:55 AM  Component Value Range   Sickle Cell Screen NEG  NEG    Imaging Studies: 12/20/11 TRANSABDOMINAL ULTRASOUND OF PELVIS Findings: Uterus: Measures measures 9.2 x 5.5 x 6.8 cm. There is an intramural fibroid, measures 5.0 x 3.8 x 4.3 cm, partially effacing the endometrial stripe. Endometrium: Measures up to 12 mm in thickness with two small cystic foci noted. Right ovary: Measures 3.3 x 2.2 x 2.7 cm. Normal sonographic appearance. Left ovary: Measures 3.1 x 2.1 x 2.8 cm. Normal sonographic appearance. Other Findings: Small amount of free fluid. IMPRESSION: 5 cm intramural fibroid that does abut the endometrium.  Assessment: Patient Active  Problem List  Diagnoses  . Fibroids  . Menorrhagia  . Symptomatic anemia due to menorrhagia   Plan: Admit to Gynecology Will check CBC, T&S Transfuse with 4 units of pRBCs; premedicate with Tylenol and Benadryl and give Lasix after 2nd unit No active bleeding, so no hormonal therapy indicated Routine gynecologic care  Jaynie Collins, M.D. 02/05/2012 9:18 AM

## 2012-02-05 NOTE — Progress Notes (Addendum)
Lab Addendum  CBC done today shows:  Lab Results  Component Value Date   WBC 7.6 02/05/2012   HGB 10.2* 02/05/2012   HCT 34.9* 02/05/2012   MCV 81.0 02/05/2012   PLT 233 02/05/2012   Compared to Hgb 6.3 on 12/20/11. She has been on oral iron three times daily.  Patient does report having dizziness and lightheadedness so will proceed with transfusion of two units of pRBCs for symptomatic anemia, then reevaluate hemoglobin.  Orders modified to reflect this new information.  Jaynie Collins, M.D. 02/05/2012 10:03 AM

## 2012-02-05 NOTE — Progress Notes (Signed)
Attending Note  Blood bank called and reported that the patient's Type and Screen showed an unknown antibody; she will need further blood samples sent to the [facility] for confirmation of antibody type.  As a result, no blood is available to be given to the patient right now; the earliest any blood can be available is tomorrow. Patient is unwilling to wait until tomorrow, she has a wedding to attend tomorrow.  She feels she is up to going to the wedding, denies feeling pre-syncopal.  I recommended she receive one liter of IV fluids which may help her occasional lightheadedness and dizziness; but also recommended she stay for the transfusion because of these symptoms.  Patient wants to go home after the IV fluids, wants to come back on Monday 02/09/12 for re-evaluation in the clinic and repeat CBC.  Appointment scheduled in the clinic for 1100 on 02/09/12.  Patient will be discharged to home after receiving IV fluids.  Anemia and bleeding precautions reviewed.  Jaynie Collins, M.D. 02/05/2012 3:46 PM

## 2012-02-05 NOTE — Discharge Instructions (Signed)
Anemia, Nonspecific Your exam and blood tests show you are anemic. This means your blood (hemoglobin) level is low. Normal hemoglobin values are 12 to 15 g/dL for females and 14 to 17 g/dL for males. Make a note of your hemoglobin level today (Your level today is 10.2). The hematocrit percent is also used to measure anemia. A normal hematocrit is 38% to 46% in females and 42% to 49% in males (Your level today is 34.9). Make a note of your hematocrit level today. CAUSES  Anemia can be due to many different causes.  Excessive bleeding from periods (in women).   Intestinal bleeding.   Poor nutrition.   Kidney, thyroid, liver, and bone marrow diseases.  SYMPTOMS  Anemia can come on suddenly (acute). It can also come on slowly. Symptoms can include:  Minor weakness.   Dizziness.   Palpitations.   Shortness of breath.  Symptoms may be absent until half your hemoglobin is missing if it comes on slowly. Anemia due to acute blood loss from an injury or internal bleeding may require blood transfusion if the loss is severe. Hospital care is needed if you are anemic and there is significant continual blood loss. TREATMENT   Stool tests for blood (Hemoccult) and additional lab tests are often needed. This determines the best treatment.   Further checking on your condition and your response to treatment is very important. It often takes many weeks to correct anemia.  Depending on the cause, treatment can include:  Supplements of iron.   Vitamins B12 and folic acid.   Hormone medicines.If your anemia is due to bleeding, finding the cause of the blood loss is very important. This will help avoid further problems.  SEEK IMMEDIATE MEDICAL CARE IF:   You develop fainting, extreme weakness, shortness of breath, or chest pain.   You develop heavy vaginal bleeding.   You develop bloody or black, tarry stools or vomit up blood.   You develop a high fever, rash, repeated vomiting, or dehydration.    Document Released: 11/06/2004 Document Revised: 09/18/2011 Document Reviewed: 08/14/2009 [facility] Patient Information 2012 [place], [place].

## 2012-02-06 NOTE — Progress Notes (Signed)
UR Chart review completed.  

## 2012-02-09 ENCOUNTER — Other Ambulatory Visit: Payer: 59

## 2012-02-09 ENCOUNTER — Encounter: Payer: Self-pay | Admitting: Obstetrics & Gynecology

## 2012-02-09 DIAGNOSIS — D649 Anemia, unspecified: Secondary | ICD-10-CM

## 2012-02-09 LAB — TYPE AND SCREEN
ABO/RH(D): A POS
DAT, IgG: NEGATIVE

## 2012-02-09 LAB — CBC
HCT: 39.2 % (ref 36.0–46.0)
Hemoglobin: 11.2 g/dL — ABNORMAL LOW (ref 12.0–15.0)
MCV: 83.8 fL (ref 78.0–100.0)
RDW: 23.7 % — ABNORMAL HIGH (ref 11.5–15.5)
WBC: 7.3 10*3/uL (ref 4.0–10.5)

## 2012-02-20 ENCOUNTER — Telehealth: Payer: Self-pay

## 2012-02-20 NOTE — Telephone Encounter (Signed)
Pt called and wanted to know test results and also when her surgery is scheduled for fibroid removal.

## 2012-02-24 NOTE — Telephone Encounter (Signed)
Called pt and left message on her personal voice mail stating the results of her recent CBC test.  I also stated that I do not have information about a surgery date and will forward the question to Dr. Macon Large.  I will call her back when I have additional information.

## 2012-03-04 ENCOUNTER — Telehealth: Payer: Self-pay | Admitting: *Deleted

## 2012-03-04 NOTE — Telephone Encounter (Signed)
Spoke with patient.  States she would like to know if her surgery has been scheduled.  States it was supposed to be done in June.  Spoke with Dr. Marice Potter.  States patient can come in for depo provera to help with bleeding if she would like.  States it will be another couple of months before she can get the patient scheduled.  Telephoned patient.  Explained surgery had not been scheduled yet and would probably be another couple of months.  Offered depo provera.  Patient declined stating she has had it before and had increased bleeding.  Told patient we would call her back lat July early August with surgery date.  Patient states understanding.

## 2012-03-04 NOTE — Telephone Encounter (Signed)
Called pt and left message that we are returning your call to please call the clinics.  

## 2012-03-04 NOTE — Telephone Encounter (Signed)
Patient called stating would like to know if surgery has been scheduled. Patient states is bleeding a lot.

## 2012-05-20 ENCOUNTER — Telehealth: Payer: Self-pay | Admitting: *Deleted

## 2012-05-20 NOTE — Telephone Encounter (Signed)
Called patient and left her a voicemail asking her to call us back.

## 2012-05-20 NOTE — Telephone Encounter (Signed)
Message copied by Mannie Stabile on Thu May 20, 2012  2:06 PM ------      Message from: Mayra Neer P      Created: Wed May 19, 2012  5:47 PM      Regarding: Surgery scheduled       Please contact patient.  Let her know her surgery has been scheduled for 07/12/12 at 7:30 am.  She will receive a call from the preoperative area with directions on when to arrive and any special physician orders that need to be followed the night before surgery.      Thanks,      [employer]

## 2012-05-21 NOTE — Telephone Encounter (Signed)
Pt returned my call and informed her of surgery date and time.

## 2012-05-21 NOTE — Telephone Encounter (Signed)
Called patient and left her a voicemail asking her to return our call.

## 2012-06-17 ENCOUNTER — Encounter (HOSPITAL_COMMUNITY): Payer: Self-pay | Admitting: *Deleted

## 2012-06-17 ENCOUNTER — Emergency Department (HOSPITAL_COMMUNITY)
Admission: EM | Admit: 2012-06-17 | Discharge: 2012-06-17 | Disposition: A | Discharge status: Home / Self Care | Payer: 59 | Source: Home / Self Care | Attending: Emergency Medicine | Admitting: Emergency Medicine

## 2012-06-17 DIAGNOSIS — L02419 Cutaneous abscess of limb, unspecified: Secondary | ICD-10-CM

## 2012-06-17 DIAGNOSIS — L03116 Cellulitis of left lower limb: Secondary | ICD-10-CM

## 2012-06-17 DIAGNOSIS — L03119 Cellulitis of unspecified part of limb: Secondary | ICD-10-CM

## 2012-06-17 MED ORDER — SULFAMETHOXAZOLE-TRIMETHOPRIM 800-160 MG PO TABS: 1.0000 | ORAL_TABLET | Freq: Two times a day (BID) | ORAL | Status: AC

## 2012-06-17 NOTE — ED Notes (Signed)
Pt  Reports  She  Noticed       A  rednned      Tender  Area  On her  l  Leg     6  Days  Ago  Which  She  Scratched  And  It  Became  Worse     -  It  Has  Been draining  Some     As  Well     sev  Days  Ago  It became  Worse

## 2012-06-17 NOTE — ED Provider Notes (Signed)
History     CSN: [id number]  Arrival date & time 06/17/12  1537   First MD Initiated Contact with Patient 06/17/12 1606      Chief Complaint  Patient presents with  . Abscess    (Consider location/radiation/quality/duration/timing/severity/associated sxs/prior treatment) HPI Comments: Patient reports having a "bump" on her left upper thigh about a week ago. Felt that this may be an insect bite. States that it turned into a pustule, which she scratched. Drained some purulent material which then scabbed over. She reports increasing pain, erythema, induration around the site. It is worse with palpation, no alleviating factors. She's not had anything for this. States she feels feverish, but no documented fevers at home. No nausea, vomiting. No similar lesions elsewhere. She is not a diabetic. She is a smoker.   ROS as noted in HPI. All other ROS negative.   Patient is a 26 y.o. female presenting with abscess. The history is provided by the patient.  Abscess  This is a new problem. The current episode started less than one week ago. The problem has been gradually worsening. The abscess is present on the left upper leg. The abscess is characterized by redness and draining. It is unknown what she was exposed to. The abscess first occurred at home. Pertinent negatives include no fever. There were no sick contacts.    Past Medical History  Diagnosis Date  . Spasm of lung air passages   . Fibroids   . Obesity, Class III, BMI 40-49.9 (morbid obesity)     BMI 40    Past Surgical History  Procedure Date  . Left arm surgey   . Therapeutic abortion     Family History  Problem Relation Age of Onset  . Fibroids Mother     History  Substance Use Topics  . Smoking status: Current Some Day Smoker -- 0.2 packs/day    Types: Cigarettes  . Smokeless tobacco: Never Used  . Alcohol Use: Yes     occasionally    OB History    Grav Para Term Preterm Abortions TAB SAB Ect Mult Living   1     1 1           Review of Systems  Constitutional: Negative for fever.    Allergies  Review of patient's allergies indicates no known allergies.  Home Medications   Current Outpatient Rx  Name Route Sig Dispense Refill  . FERROUS SULFATE 325 (65 FE) MG PO TABS Oral Take 1 tablet (325 mg total) by mouth 3 (three) times daily with meals. 90 tablet 11  . SULFAMETHOXAZOLE-TRIMETHOPRIM 800-160 MG PO TABS Oral Take 1 tablet by mouth 2 (two) times daily. X 10 days 20 tablet 0    BP 131/67  Pulse 106  Temp 98.5 F (36.9 C) (Oral)  Resp 16  SpO2 98%  LMP 06/08/2012  Physical Exam  Nursing note and vitals reviewed. Constitutional: She is oriented to person, place, and time. She appears well-developed and well-nourished. No distress.  HENT:  Head: Normocephalic and atraumatic.  Eyes: Conjunctivae and EOM are normal.  Neck: Normal range of motion.  Cardiovascular: Normal rate.   Pulmonary/Chest: Effort normal.  Abdominal: She exhibits no distension.  Musculoskeletal: Normal range of motion.       Legs:      8 x 9 cm area of erythema induration L upper thigh no expressible drainage  Neurological: She is alert and oriented to person, place, and time. Coordination normal.  Skin: Skin is warm and  dry.  Psychiatric: She has a normal mood and affect. Her behavior is normal. Judgment and thought content normal.    ED Course  Procedures (including critical care time)  Labs Reviewed - No data to display No results found.   1. Cellulitis of left thigh       MDM  Nothing to I&D at this time. Will have patient start warm compresses, and Bactrim. Marked area of erythema with permanent marker for reference. She'll return in 2 days if no significant improvement. Discussed signs and symptoms that should prompt earlier return to the department.  Luiz Blare, MD 06/17/12 [phone]

## 2012-06-23 ENCOUNTER — Telehealth: Payer: Self-pay | Admitting: *Deleted

## 2012-06-23 DIAGNOSIS — D219 Benign neoplasm of connective and other soft tissue, unspecified: Secondary | ICD-10-CM

## 2012-06-23 NOTE — Telephone Encounter (Signed)
Message copied by Mannie Stabile on Wed Jun 23, 2012  2:37 PM ------      Message from: Willodean Rosenthal      Created: Thu Jun 17, 2012  3:49 PM       Please schedule pelvic MRI in preparation for robotic myomectomy.  Please make sur ept has preop visit scheduled with me prior to surgery.            Thx,      clh-S

## 2012-06-23 NOTE — Telephone Encounter (Signed)
Spoke with patient and informed her of the upcoming appts. Also gave her the number to reschedule the MRI if necessary. Pt voiced understanding about the need for these appts prior to surgery and had no further questions.

## 2012-06-23 NOTE — Telephone Encounter (Signed)
Called pt to discuss upcoming appts with her. I left her a message to call back. She is scheduled for MRI of Pelvis on 06/28/12 at 0900, She will need to arrive at Radiology on 1st floor at [facility] at 0845. If this appt doesn't work for her she can call and reschedule at [phone]. Also need to inform her of clinic appt with Dr. Erin Fulling on 07/05/12 at 2:15.

## 2012-06-23 NOTE — Telephone Encounter (Signed)
Clinic appt scheduled for 07/05/12 at 2:15pm.

## 2012-06-28 ENCOUNTER — Other Ambulatory Visit (HOSPITAL_COMMUNITY): Payer: 59

## 2012-06-28 ENCOUNTER — Encounter (HOSPITAL_COMMUNITY): Payer: Self-pay

## 2012-07-01 ENCOUNTER — Encounter (HOSPITAL_COMMUNITY)
Admission: RE | Admit: 2012-07-01 | Discharge: 2012-07-01 | Disposition: A | Discharge status: Home / Self Care | Payer: 59 | Source: Ambulatory Visit | Attending: Obstetrics & Gynecology | Admitting: Obstetrics & Gynecology

## 2012-07-01 ENCOUNTER — Encounter (HOSPITAL_COMMUNITY): Payer: Self-pay

## 2012-07-01 ENCOUNTER — Inpatient Hospital Stay (HOSPITAL_COMMUNITY): Admission: RE | Admit: 2012-07-01 | Payer: 59 | Source: Ambulatory Visit

## 2012-07-01 HISTORY — DX: Anemia, unspecified: D64.9

## 2012-07-01 HISTORY — DX: Shortness of breath: R06.02

## 2012-07-01 HISTORY — DX: Urinary tract infection, site not specified: N39.0

## 2012-07-01 LAB — CBC
HCT: 34.1 % — ABNORMAL LOW (ref 36.0–46.0)
Hemoglobin: 10.3 g/dL — ABNORMAL LOW (ref 12.0–15.0)
MCV: 87.9 fL (ref 78.0–100.0)
RBC: 3.88 MIL/uL (ref 3.87–5.11)
RDW: 13.6 % (ref 11.5–15.5)
WBC: 7.5 10*3/uL (ref 4.0–10.5)

## 2012-07-01 LAB — SURGICAL PCR SCREEN: Staphylococcus aureus: NEGATIVE

## 2012-07-01 NOTE — Patient Instructions (Signed)
Your procedure is scheduled on:07/12/12  Enter through the Main Entrance at :6am Pick up desk phone and dial [phone] and inform us of your arrival.  Please call [phone] if you have any problems the morning of surgery.  Remember: Do not eat after midnight:Sunday Do not drink after:midnight Sunday  Take these meds the morning of surgery with a sip of water:none  DO NOT wear jewelry, eye make-up, lipstick,body lotion, or dark fingernail polish. Do not shave for 48 hours prior to surgery.  If you are to be admitted after surgery, leave suitcase in car until your room has been assigned. Patients discharged on the day of surgery will not be allowed to drive home.   Remember to use your Hibiclens as instructed.

## 2012-07-05 ENCOUNTER — Encounter: Payer: Self-pay | Admitting: Obstetrics & Gynecology

## 2012-07-05 ENCOUNTER — Ambulatory Visit (INDEPENDENT_AMBULATORY_CARE_PROVIDER_SITE_OTHER): Payer: 59 | Admitting: Obstetrics & Gynecology

## 2012-07-05 VITALS — BP 126/80 | HR 76 | Temp 98.0°F | Resp 12 | Ht 68.0 in | Wt 278.1 lb

## 2012-07-05 DIAGNOSIS — N920 Excessive and frequent menstruation with regular cycle: Secondary | ICD-10-CM

## 2012-07-05 DIAGNOSIS — D251 Intramural leiomyoma of uterus: Secondary | ICD-10-CM

## 2012-07-05 NOTE — Progress Notes (Signed)
Subjective:     Patient ID: Yvonne [name], female   DOB: 12/06/1985, 26 y.o.   MRN: [id number]  HPI Pt referred for robotic assisted myomectomy.  No new complaints.  Her biggest issue is heavy bleeding and pain.  Sx not responsive to conservative measures.   Review of Systems n/c     Objective:   Physical ExamBP 126/80  Pulse 76  Temp 98 F (36.7 C) (Oral)  Resp 12  Ht 5\' 8"  (1.727 m)  Wt 278 lb 1.6 oz (126.145 kg)  BMI 42.28 kg/m2  LMP 06/25/2012 Lungs: CTA Cv:  RRR Abd: obese; NT; ND   GU: EGBUS: no lesions Vagina: no blood in vault Cervix: no lesion; no mucopurulent d/c Uterus: sl enlarged, mobile; fibroids palpable Adnexa: no masses; non tender   Sono 3/13 intramural fibroid, measures 5.0 x 3.8 x 4.3 cm, partially effacing  the endometrial stripe.  Endometrium: Measures up to 12 mm in thickness with two small  cystic foci noted.  Right ovary: Measures 3.3 x 2.2 x 2.7 cm. Normal sonographic  appearance.  Left ovary: Measures 3.1 x 2.1 x 2.8 cm. Normal sonographic  appearance.  Other Findings: Small amount of free fluid.  IMPRESSION:  5 cm intramural fibroid that does abut the endometrium.         Assessment:     Uterine fibroids- symptomatic.  For operative removal    Plan:     For laparoscopic assisted myomectomy on 07/12/12 Need MRI PRIOR to OR reviewed with pt case and risks and alternatives to the procedure  Ryelan Kazee L. Harraway-Smith, M.D., [facility]

## 2012-07-05 NOTE — Patient Instructions (Addendum)
Menorrhagia Dysfunctional uterine bleeding is different from a normal menstrual period. When periods are heavy or there is more bleeding than is usual for you, it is called menorrhagia. It may be caused by hormonal imbalance, or physical, metabolic, or other problems. Examination is necessary in order that your caregiver may treat treatable causes. If this is a continuing problem, a D&C may be needed. That means that the cervix (the opening of the uterus or womb) is dilated (stretched larger) and the lining of the uterus is scraped out. The tissue scraped out is then examined under a microscope by a specialist (pathologist) to make sure there is nothing of concern that needs further or more extensive treatment. HOME CARE INSTRUCTIONS   If medications were prescribed, take exactly as directed. Do not change or switch medications without consulting your caregiver.   Long term heavy bleeding may result in iron deficiency. Your caregiver may have prescribed iron pills. They help replace the iron your body lost from heavy bleeding. Take exactly as directed. Iron may cause constipation. If this becomes a problem, increase the bran, fruits, and roughage in your diet.   Do not take aspirin or medicines that contain aspirin one week before or during your menstrual period. Aspirin may make the bleeding worse.   If you need to change your sanitary pad or tampon more than once every 2 hours, stay in bed and rest as much as possible until the bleeding stops.   Eat well-balanced meals. Eat foods high in iron. Examples are leafy green vegetables, meat, liver, eggs, and whole grain breads and cereals. Do not try to lose weight until the abnormal bleeding has stopped and your blood iron level is back to normal.  SEEK MEDICAL CARE IF:   You need to change your sanitary pad or tampon more than once an hour.   You develop nausea (feeling sick to your stomach) and vomiting, dizziness, or diarrhea while you are taking  your medicine.   You have any problems that may be related to the medicine you are taking.  SEEK IMMEDIATE MEDICAL CARE IF:   You have a fever.   You develop chills.   You develop severe bleeding or start to pass blood clots.   You feel dizzy or faint.  MAKE SURE YOU:   Understand these instructions.   Will watch your condition.   Will get help right away if you are not doing well or get worse.  Document Released: 09/29/2005 Document Revised: 09/18/2011 Document Reviewed: 05/19/2008 [facility] Patient Information 2012 [place], [place].Fibroids You have been diagnosed as having a fibroid. Fibroids are smooth muscle lumps (tumors) which can occur any place in a woman's body. They are usually in the womb (uterus). The most common problem (symptom) of fibroids is bleeding. Over time this may cause low red blood cells (anemia). Other symptoms include feelings of pressure and pain in the pelvis. The diagnosis (learning what is wrong) of fibroids is made by physical exam. Sometimes tests such as an ultrasound are used. This is helpful when fibroids are felt around the ovaries and to look for tumors. TREATMENT   Most fibroids do not need surgical or medical treatment. Sometimes a tissue sample (biopsy) of the lining of the uterus is done to rule out cancer. If there is no cancer and only a small amount of bleeding, the problem can be watched.   Hormonal treatment can improve the problem.   When surgery is needed, it can consist of removing the fibroid. Vaginal  birth may not be possible after the removal of fibroids. This depends on where they are and the extent of surgery. When pregnancy occurs with fibroids it is usually normal.   Your caregiver can help decide which treatments are best for you.  HOME CARE INSTRUCTIONS   Do not use aspirin as this may increase bleeding problems.   If your periods (menses) are heavy, record the number of pads or tampons used per month. Bring this information  to your caregiver. This can help them determine the best treatment for you.  SEEK IMMEDIATE MEDICAL CARE IF:  You have pelvic pain or cramps not controlled with medications, or experience a sudden increase in pain.   You have an increase of pelvic bleeding between and during menses.   You feel lightheaded or have fainting spells.   You develop worsening belly (abdominal) pain.  Document Released: 09/26/2000 Document Revised: 09/18/2011 Document Reviewed: 05/18/2008 [facility] Patient Information 2012 [place], [place].

## 2012-07-07 ENCOUNTER — Other Ambulatory Visit: Payer: Self-pay | Admitting: Obstetrics and Gynecology

## 2012-07-08 ENCOUNTER — Ambulatory Visit (HOSPITAL_COMMUNITY)
Admission: RE | Admit: 2012-07-08 | Discharge: 2012-07-08 | Disposition: A | Discharge status: Home / Self Care | Payer: 59 | Source: Ambulatory Visit | Attending: Obstetrics & Gynecology | Admitting: Obstetrics & Gynecology

## 2012-07-08 DIAGNOSIS — D219 Benign neoplasm of connective and other soft tissue, unspecified: Secondary | ICD-10-CM

## 2012-07-08 DIAGNOSIS — D250 Submucous leiomyoma of uterus: Secondary | ICD-10-CM | POA: Insufficient documentation

## 2012-07-08 DIAGNOSIS — D252 Subserosal leiomyoma of uterus: Secondary | ICD-10-CM | POA: Insufficient documentation

## 2012-07-08 MED ORDER — GADOBENATE DIMEGLUMINE 529 MG/ML IV SOLN
15.0000 mL | Freq: Once | INTRAVENOUS | Status: AC
  Administered 2012-07-08: 15 mL via INTRAVENOUS

## 2012-07-11 ENCOUNTER — Encounter (HOSPITAL_COMMUNITY): Payer: Self-pay | Admitting: Anesthesiology

## 2012-07-11 NOTE — Anesthesia Preprocedure Evaluation (Addendum)
Anesthesia Evaluation  Patient identified by MRN, date of birth, ID band Patient awake    Reviewed: Allergy & Precautions, H&P , NPO status , Patient's Chart, lab work & pertinent test results  Airway Mallampati: III TM Distance: >3 FB     Dental No notable dental hx. (+) Teeth Intact   Pulmonary shortness of breath and with exertion, Current Smoker,  breath sounds clear to auscultation  Pulmonary exam normal       Cardiovascular negative cardio ROS  Rhythm:Regular Rate:Normal     Neuro/Psych negative neurological ROS  negative psych ROS   GI/Hepatic negative GI ROS, Neg liver ROS,   Endo/Other  Morbid obesity  Renal/GU negative Renal ROS  negative genitourinary   Musculoskeletal negative musculoskeletal ROS (+)   Abdominal (+) + obese,   Peds  Hematology Anti-Lewis A & B   Anesthesia Other Findings   Reproductive/Obstetrics DUB Uterine Fibroids                          Anesthesia Physical Anesthesia Plan  ASA: III  Anesthesia Plan: General   Post-op Pain Management:    Induction: Intravenous  Airway Management Planned: Oral ETT  Additional Equipment:   Intra-op Plan:   Post-operative Plan: Extubation in OR  Informed Consent: I have reviewed the patients History and Physical, chart, labs and discussed the procedure including the risks, benefits and alternatives for the proposed anesthesia with the patient or authorized representative who has indicated his/her understanding and acceptance.   Dental advisory given  Plan Discussed with: CRNA, Anesthesiologist and Surgeon  Anesthesia Plan Comments:        Anesthesia Quick Evaluation

## 2012-07-12 ENCOUNTER — Encounter (HOSPITAL_COMMUNITY)
Admission: RE | Disposition: A | Discharge status: Home / Self Care | Payer: Self-pay | Source: Ambulatory Visit | Attending: Obstetrics & Gynecology

## 2012-07-12 ENCOUNTER — Ambulatory Visit (HOSPITAL_COMMUNITY): Payer: 59 | Admitting: Anesthesiology

## 2012-07-12 ENCOUNTER — Encounter (HOSPITAL_COMMUNITY): Payer: Self-pay

## 2012-07-12 ENCOUNTER — Ambulatory Visit (HOSPITAL_COMMUNITY)
Admission: RE | Admit: 2012-07-12 | Discharge: 2012-07-12 | Disposition: A | Discharge status: Home / Self Care | Payer: 59 | Source: Ambulatory Visit | Attending: Obstetrics & Gynecology | Admitting: Obstetrics & Gynecology

## 2012-07-12 ENCOUNTER — Encounter (HOSPITAL_COMMUNITY): Payer: Self-pay | Admitting: Anesthesiology

## 2012-07-12 DIAGNOSIS — D649 Anemia, unspecified: Secondary | ICD-10-CM | POA: Insufficient documentation

## 2012-07-12 DIAGNOSIS — N938 Other specified abnormal uterine and vaginal bleeding: Secondary | ICD-10-CM | POA: Insufficient documentation

## 2012-07-12 DIAGNOSIS — D251 Intramural leiomyoma of uterus: Secondary | ICD-10-CM | POA: Insufficient documentation

## 2012-07-12 DIAGNOSIS — Z01812 Encounter for preprocedural laboratory examination: Secondary | ICD-10-CM | POA: Insufficient documentation

## 2012-07-12 DIAGNOSIS — Z01818 Encounter for other preprocedural examination: Secondary | ICD-10-CM | POA: Insufficient documentation

## 2012-07-12 DIAGNOSIS — N920 Excessive and frequent menstruation with regular cycle: Secondary | ICD-10-CM

## 2012-07-12 DIAGNOSIS — N949 Unspecified condition associated with female genital organs and menstrual cycle: Secondary | ICD-10-CM | POA: Insufficient documentation

## 2012-07-12 DIAGNOSIS — D250 Submucous leiomyoma of uterus: Secondary | ICD-10-CM | POA: Insufficient documentation

## 2012-07-12 HISTORY — PX: ROBOT ASSISTED MYOMECTOMY: SHX5142

## 2012-07-12 LAB — CBC
HCT: 32.7 % — ABNORMAL LOW (ref 36.0–46.0)
Hemoglobin: 9.8 g/dL — ABNORMAL LOW (ref 12.0–15.0)
MCH: 26.3 pg (ref 26.0–34.0)
MCHC: 30 g/dL (ref 30.0–36.0)
MCV: 87.7 fL (ref 78.0–100.0)
RBC: 3.73 MIL/uL — ABNORMAL LOW (ref 3.87–5.11)

## 2012-07-12 LAB — PREGNANCY, URINE: Preg Test, Ur: NEGATIVE

## 2012-07-12 SURGERY — ROBOTIC ASSISTED MYOMECTOMY
Anesthesia: General | Site: Abdomen | Wound class: Clean Contaminated

## 2012-07-12 MED ORDER — DEXTROSE 5 % IV SOLN
3.0000 g | INTRAVENOUS | Status: AC
  Administered 2012-07-12: 3 g via INTRAVENOUS
  Filled 2012-07-12: qty 3000

## 2012-07-12 MED ORDER — PROPOFOL 10 MG/ML IV EMUL
INTRAVENOUS | Status: AC
  Filled 2012-07-12: qty 20

## 2012-07-12 MED ORDER — LACTATED RINGERS IR SOLN
Status: DC | PRN
  Administered 2012-07-12: 3000 mL

## 2012-07-12 MED ORDER — DEXTROSE 5 % IV SOLN: 3.0000 g | INTRAVENOUS | Status: DC

## 2012-07-12 MED ORDER — VASOPRESSIN 20 UNIT/ML IJ SOLN
INTRAMUSCULAR | Status: DC | PRN
  Administered 2012-07-12: 40 [IU]

## 2012-07-12 MED ORDER — LIDOCAINE HCL (CARDIAC) 20 MG/ML IV SOLN
INTRAVENOUS | Status: AC
  Filled 2012-07-12: qty 5

## 2012-07-12 MED ORDER — MIDAZOLAM HCL 5 MG/5ML IJ SOLN
INTRAMUSCULAR | Status: DC | PRN
  Administered 2012-07-12: 2 mg via INTRAVENOUS

## 2012-07-12 MED ORDER — BUPIVACAINE HCL (PF) 0.5 % IJ SOLN
INTRAMUSCULAR | Status: DC | PRN
  Administered 2012-07-12: 20 mL

## 2012-07-12 MED ORDER — PROPOFOL 10 MG/ML IV EMUL
INTRAVENOUS | Status: DC | PRN
  Administered 2012-07-12: 200 mg via INTRAVENOUS

## 2012-07-12 MED ORDER — OXYCODONE-ACETAMINOPHEN 7.5-500 MG PO TABS: 1.0000 | ORAL_TABLET | ORAL | Status: DC | PRN

## 2012-07-12 MED ORDER — SCOPOLAMINE 1 MG/3DAYS TD PT72
1.0000 | MEDICATED_PATCH | TRANSDERMAL | Status: DC
  Administered 2012-07-12: 1.5 mg via TRANSDERMAL

## 2012-07-12 MED ORDER — DEXAMETHASONE SODIUM PHOSPHATE 10 MG/ML IJ SOLN
INTRAMUSCULAR | Status: DC | PRN
  Administered 2012-07-12: 10 mg via INTRAVENOUS

## 2012-07-12 MED ORDER — OXYCODONE-ACETAMINOPHEN 5-325 MG PO TABS
2.0000 | ORAL_TABLET | Freq: Four times a day (QID) | ORAL | Status: DC | PRN
  Administered 2012-07-12: 2 via ORAL
  Filled 2012-07-12: qty 2

## 2012-07-12 MED ORDER — ROCURONIUM BROMIDE 50 MG/5ML IV SOLN
INTRAVENOUS | Status: AC
  Filled 2012-07-12: qty 1

## 2012-07-12 MED ORDER — IBUPROFEN 600 MG PO TABS: 600.0000 mg | ORAL_TABLET | Freq: Four times a day (QID) | ORAL | Status: DC | PRN

## 2012-07-12 MED ORDER — ROCURONIUM BROMIDE 100 MG/10ML IV SOLN
INTRAVENOUS | Status: DC | PRN
  Administered 2012-07-12: 10 mg via INTRAVENOUS
  Administered 2012-07-12: 20 mg via INTRAVENOUS
  Administered 2012-07-12: 10 mg via INTRAVENOUS
  Administered 2012-07-12: 20 mg via INTRAVENOUS
  Administered 2012-07-12: 40 mg via INTRAVENOUS

## 2012-07-12 MED ORDER — DEXTROSE-NACL 5-0.45 % IV SOLN: INTRAVENOUS | Status: DC

## 2012-07-12 MED ORDER — ONDANSETRON HCL 4 MG/2ML IJ SOLN
INTRAMUSCULAR | Status: DC | PRN
  Administered 2012-07-12: 4 mg via INTRAVENOUS

## 2012-07-12 MED ORDER — BUPIVACAINE HCL (PF) 0.5 % IJ SOLN
INTRAMUSCULAR | Status: AC
  Filled 2012-07-12: qty 30

## 2012-07-12 MED ORDER — ROPIVACAINE HCL 5 MG/ML IJ SOLN
INTRAMUSCULAR | Status: AC
  Filled 2012-07-12: qty 60

## 2012-07-12 MED ORDER — FENTANYL CITRATE 0.05 MG/ML IJ SOLN
INTRAMUSCULAR | Status: AC
  Filled 2012-07-12: qty 5

## 2012-07-12 MED ORDER — ARTIFICIAL TEARS OP OINT
TOPICAL_OINTMENT | OPHTHALMIC | Status: AC
  Filled 2012-07-12: qty 3.5

## 2012-07-12 MED ORDER — VASOPRESSIN 20 UNIT/ML IJ SOLN
INTRAMUSCULAR | Status: AC
  Filled 2012-07-12: qty 1

## 2012-07-12 MED ORDER — MIDAZOLAM HCL 2 MG/2ML IJ SOLN
INTRAMUSCULAR | Status: AC
  Filled 2012-07-12: qty 2

## 2012-07-12 MED ORDER — METOCLOPRAMIDE HCL 5 MG/ML IJ SOLN: 10.0000 mg | Freq: Once | INTRAMUSCULAR | Status: DC | PRN

## 2012-07-12 MED ORDER — SCOPOLAMINE 1 MG/3DAYS TD PT72
MEDICATED_PATCH | TRANSDERMAL | Status: AC
  Filled 2012-07-12: qty 1

## 2012-07-12 MED ORDER — MEPERIDINE HCL 25 MG/ML IJ SOLN: 6.2500 mg | INTRAMUSCULAR | Status: DC | PRN

## 2012-07-12 MED ORDER — SODIUM CHLORIDE 0.9 % IJ SOLN
INTRAMUSCULAR | Status: DC | PRN
  Administered 2012-07-12: 100 mL

## 2012-07-12 MED ORDER — KETOROLAC TROMETHAMINE 30 MG/ML IJ SOLN
30.0000 mg | Freq: Once | INTRAMUSCULAR | Status: AC
  Administered 2012-07-12: 30 mg via INTRAVENOUS
  Filled 2012-07-12: qty 1

## 2012-07-12 MED ORDER — ONDANSETRON HCL 4 MG/2ML IJ SOLN
INTRAMUSCULAR | Status: AC
  Filled 2012-07-12: qty 2

## 2012-07-12 MED ORDER — NEOSTIGMINE METHYLSULFATE 1 MG/ML IJ SOLN
INTRAMUSCULAR | Status: DC | PRN
  Administered 2012-07-12: 2 mg via INTRAVENOUS

## 2012-07-12 MED ORDER — FENTANYL CITRATE 0.05 MG/ML IJ SOLN
INTRAMUSCULAR | Status: AC
  Filled 2012-07-12: qty 2

## 2012-07-12 MED ORDER — FENTANYL CITRATE 0.05 MG/ML IJ SOLN
25.0000 ug | INTRAMUSCULAR | Status: DC | PRN
  Administered 2012-07-12 (×2): 25 ug via INTRAVENOUS

## 2012-07-12 MED ORDER — GLYCOPYRROLATE 0.2 MG/ML IJ SOLN
INTRAMUSCULAR | Status: AC
  Filled 2012-07-12: qty 1

## 2012-07-12 MED ORDER — FENTANYL CITRATE 0.05 MG/ML IJ SOLN
INTRAMUSCULAR | Status: DC | PRN
  Administered 2012-07-12: 50 ug via INTRAVENOUS
  Administered 2012-07-12: 100 ug via INTRAVENOUS
  Administered 2012-07-12: 50 ug via INTRAVENOUS
  Administered 2012-07-12: 100 ug via INTRAVENOUS
  Administered 2012-07-12: 50 ug via INTRAVENOUS
  Administered 2012-07-12: 100 ug via INTRAVENOUS

## 2012-07-12 MED ORDER — NEOSTIGMINE METHYLSULFATE 1 MG/ML IJ SOLN
INTRAMUSCULAR | Status: AC
  Filled 2012-07-12: qty 10

## 2012-07-12 MED ORDER — DEXAMETHASONE SODIUM PHOSPHATE 10 MG/ML IJ SOLN
INTRAMUSCULAR | Status: AC
  Filled 2012-07-12: qty 1

## 2012-07-12 MED ORDER — LACTATED RINGERS IV SOLN
INTRAVENOUS | Status: DC
  Administered 2012-07-12: 125 mL/h via INTRAVENOUS
  Administered 2012-07-12 (×2): via INTRAVENOUS

## 2012-07-12 MED ORDER — LIDOCAINE HCL (CARDIAC) 20 MG/ML IV SOLN
INTRAVENOUS | Status: DC | PRN
  Administered 2012-07-12: 50 mg via INTRAVENOUS

## 2012-07-12 MED ORDER — GLYCOPYRROLATE 0.2 MG/ML IJ SOLN
INTRAMUSCULAR | Status: DC | PRN
  Administered 2012-07-12: 0.4 mg via INTRAVENOUS

## 2012-07-12 SURGICAL SUPPLY — 78 items
BAG URINE DRAINAGE (UROLOGICAL SUPPLIES) ×2 IMPLANT
BARRIER ADHS 3X4 INTERCEED (GAUZE/BANDAGES/DRESSINGS) ×2 IMPLANT
BENZOIN TINCTURE PRP APPL 2/3 (GAUZE/BANDAGES/DRESSINGS) IMPLANT
BLADE LAPAROSCOPIC MORCELL KIT (BLADE) ×2 IMPLANT
CABLE HIGH FREQUENCY MONO STRZ (ELECTRODE) ×2 IMPLANT
CATH FOLEY 3WAY  5CC 16FR (CATHETERS) ×1
CATH FOLEY 3WAY 5CC 16FR (CATHETERS) ×1 IMPLANT
CHLORAPREP W/TINT 26ML (MISCELLANEOUS) ×2 IMPLANT
CLOTH BEACON ORANGE TIMEOUT ST (SAFETY) ×2 IMPLANT
CONT PATH 16OZ SNAP LID 3702 (MISCELLANEOUS) ×2 IMPLANT
COVER MAYO STAND STRL (DRAPES) ×2 IMPLANT
COVER TABLE BACK 60X90 (DRAPES) ×6 IMPLANT
COVER TIP SHEARS 8 DVNC (MISCELLANEOUS) ×1 IMPLANT
COVER TIP SHEARS 8MM DA VINCI (MISCELLANEOUS) ×1
DECANTER SPIKE VIAL GLASS SM (MISCELLANEOUS) ×4 IMPLANT
DERMABOND ADVANCED (GAUZE/BANDAGES/DRESSINGS) ×1
DERMABOND ADVANCED .7 DNX12 (GAUZE/BANDAGES/DRESSINGS) ×1 IMPLANT
DEVICE TROCAR PUNCTURE CLOSURE (ENDOMECHANICALS) IMPLANT
DRAPE HUG U DISPOSABLE (DRAPE) ×2 IMPLANT
DRAPE LG THREE QUARTER DISP (DRAPES) ×4 IMPLANT
DRAPE MONITOR DA VINCI (DRAPE) IMPLANT
DRAPE WARM FLUID 44X44 (DRAPE) ×2 IMPLANT
ELECT REM PT RETURN 9FT ADLT (ELECTROSURGICAL) ×2
ELECTRODE REM PT RTRN 9FT ADLT (ELECTROSURGICAL) ×1 IMPLANT
EVACUATOR SMOKE 8.L (FILTER) IMPLANT
GAUZE VASELINE 3X9 (GAUZE/BANDAGES/DRESSINGS) IMPLANT
GLOVE BIO SURGEON STRL SZ 6.5 (GLOVE) ×6 IMPLANT
GLOVE BIOGEL PI IND STRL 7.0 (GLOVE) ×6 IMPLANT
GLOVE BIOGEL PI INDICATOR 7.0 (GLOVE) ×6
GLOVE ECLIPSE 6.5 STRL STRAW (GLOVE) ×6 IMPLANT
GLOVE INDICATOR 7.0 STRL GRN (GLOVE) ×8 IMPLANT
GOWN STRL REIN XL XLG (GOWN DISPOSABLE) ×16 IMPLANT
KIT ACCESSORY DA VINCI DISP (KITS) ×1
KIT ACCESSORY DVNC DISP (KITS) ×1 IMPLANT
KIT DISP ACCESSORY 4 ARM (KITS) IMPLANT
LEGGING LITHOTOMY PAIR STRL (DRAPES) ×2 IMPLANT
MANIPULATOR UTERINE 4.5 ZUMI (MISCELLANEOUS) IMPLANT
NEEDLE BLUNT 18X1 FOR OR ONLY (NEEDLE) ×2 IMPLANT
NEEDLE INSUFFLATION 14GA 120MM (NEEDLE) ×2 IMPLANT
NEEDLE SPNL 18GX3.5 QUINCKE PK (NEEDLE) IMPLANT
NEEDLE SPNL 22GX7 QUINCKE BK (NEEDLE) ×2 IMPLANT
OCCLUDER COLPOPNEUMO (BALLOONS) ×2 IMPLANT
PACK LAVH (CUSTOM PROCEDURE TRAY) ×2 IMPLANT
PAD OB MATERNITY 4.3X12.25 (PERSONAL CARE ITEMS) ×2 IMPLANT
PAD PREP 24X48 CUFFED NSTRL (MISCELLANEOUS) ×6 IMPLANT
PLUG CATH AND CAP STER (CATHETERS) ×2 IMPLANT
PROTECTOR NERVE ULNAR (MISCELLANEOUS) ×4 IMPLANT
SET CYSTO W/LG BORE CLAMP LF (SET/KITS/TRAYS/PACK) ×2 IMPLANT
SET IRRIG TUBING LAPAROSCOPIC (IRRIGATION / IRRIGATOR) ×2 IMPLANT
SOLUTION ELECTROLUBE (MISCELLANEOUS) ×2 IMPLANT
SPONGE LAP 18X18 X RAY DECT (DISPOSABLE) IMPLANT
STRIP CLOSURE SKIN 1/2X4 (GAUZE/BANDAGES/DRESSINGS) IMPLANT
SUT MNCRL AB 4-0 PS2 18 (SUTURE) ×4 IMPLANT
SUT VIC AB 0 CT1 27 (SUTURE) ×2
SUT VIC AB 0 CT1 27XBRD ANBCTR (SUTURE) ×2 IMPLANT
SUT VIC AB 0 CT1 27XBRD ANTBC (SUTURE) IMPLANT
SUT VICRYL 0 UR6 27IN ABS (SUTURE) ×4 IMPLANT
SUT VLOC 180 0 9IN  GS21 (SUTURE)
SUT VLOC 180 0 9IN GS21 (SUTURE) IMPLANT
SUT VLOC 180 2-0 9IN GS21 (SUTURE) ×6 IMPLANT
SYR 50ML LL SCALE MARK (SYRINGE) ×2 IMPLANT
SYR 5ML LL (SYRINGE) ×2 IMPLANT
SYR CONTROL 10ML LL (SYRINGE) ×4 IMPLANT
SYSTEM CONVERTIBLE TROCAR (TROCAR) IMPLANT
TIP UTERINE 5.1X6CM LAV DISP (MISCELLANEOUS) IMPLANT
TIP UTERINE 6.7X10CM GRN DISP (MISCELLANEOUS) IMPLANT
TIP UTERINE 6.7X6CM WHT DISP (MISCELLANEOUS) IMPLANT
TIP UTERINE 6.7X8CM BLUE DISP (MISCELLANEOUS) ×2 IMPLANT
TOWEL OR 17X24 6PK STRL BLUE (TOWEL DISPOSABLE) ×6 IMPLANT
TROCAR 12M 150ML BLUNT (TROCAR) ×2 IMPLANT
TROCAR DISP BLADELESS 8 DVNC (TROCAR) ×2 IMPLANT
TROCAR DISP BLADELESS 8MM (TROCAR) ×2
TROCAR XCEL 12X100 BLDLESS (ENDOMECHANICALS) IMPLANT
TROCAR XCEL NON BLADE 8MM B8LT (ENDOMECHANICALS) ×2 IMPLANT
TROCAR XCEL NON-BLD 5MMX100MML (ENDOMECHANICALS) ×2 IMPLANT
TROCAR Z-THREAD 12X150 (TROCAR) ×2 IMPLANT
TUBING FILTER THERMOFLATOR (ELECTROSURGICAL) ×2 IMPLANT
WATER STERILE IRR 1000ML POUR (IV SOLUTION) ×6 IMPLANT

## 2012-07-12 NOTE — Anesthesia Postprocedure Evaluation (Signed)
  Anesthesia Post-op Note  Patient: Yvonne [name]  Procedure(s) Performed: Procedure(s) (LRB) with comments: ROBOTIC ASSISTED MYOMECTOMY (N/A)  Patient Location: [facility]  Anesthesia Type: General  Level of Consciousness: sedated  Airway and Oxygen Therapy: Patient Spontanous Breathing and Patient connected to nasal cannula oxygen  Post-op Pain: mild  Post-op Assessment: Post-op Vital signs reviewed  Post-op Vital Signs: Reviewed and stable  Complications: No apparent anesthesia complications

## 2012-07-12 NOTE — Anesthesia Postprocedure Evaluation (Signed)
Anesthesia Post Note  Patient: Yvonne [name]  Procedure(s) Performed: Procedure(s) (LRB): ROBOTIC ASSISTED MYOMECTOMY (N/A)  Anesthesia type: GA  Patient location: PACU  Post pain: Pain level controlled  Post assessment: Post-op Vital signs reviewed  Last Vitals:  Filed Vitals:   07/12/12 1230  BP: 139/79  Pulse: 102  Temp:   Resp: 28    Post vital signs: Reviewed  Level of consciousness: sedated  Complications: No apparent anesthesia complications

## 2012-07-12 NOTE — Transfer of Care (Signed)
Immediate Anesthesia Transfer of Care Note  Patient: Yvonne [name]  Procedure(s) Performed: Procedure(s) (LRB) with comments: ROBOTIC ASSISTED MYOMECTOMY (N/A)  Patient Location: PACU  Anesthesia Type: General  Level of Consciousness: sedated  Airway & Oxygen Therapy: Patient Spontanous Breathing and Patient connected to nasal cannula oxygen  Post-op Assessment: Report given to PACU RN  Post vital signs: Reviewed and stable  Complications: No apparent anesthesia complications

## 2012-07-12 NOTE — OR Nursing (Signed)
X-ray arrived to OR room 7 at 1105.  Suture needle found at 1107 before X-ray performed.

## 2012-07-12 NOTE — Addendum Note (Signed)
Addendum  created 07/12/12 1950 by Algis Greenhouse, CRNA   Modules edited:Notes Section

## 2012-07-12 NOTE — H&P (Signed)
  HPI  Pt referred for robotic assisted myomectomy. No new complaints. Her biggest issue is heavy bleeding and pain. Sx not responsive to conservative measures.   Pmhx: None  PShx:  TAB 2002 Left arm fx- reset at age 52yrs  Meds: None  ALL: NKDA SH: THC last night H/o tob none x 2 weeks occ ETOH.  Last drink 2 days prev   Review of Systems  n/c  Objective:   Physical ExamBP 126/80  Pulse 76  Temp 98 F (36.7 C) (Oral)  Resp 12  Ht 5\' 8"  (1.727 m)  Wt 278 lb 1.6 oz (126.145 kg)  BMI 42.28 kg/m2  LMP 06/25/2012  Lungs: CTA  Cv: RRR  Abd: obese; NT; ND  GU:  EGBUS: no lesions  Vagina: no blood in vault  Cervix: no lesion; no mucopurulent d/c  Uterus: sl enlarged, mobile; fibroids palpable  Adnexa: no masses; non tender   Sono 3/13  intramural fibroid, measures 5.0 x 3.8 x 4.3 cm, partially effacing  the endometrial stripe.  Endometrium: Measures up to 12 mm in thickness with two small  cystic foci noted.  Right ovary: Measures 3.3 x 2.2 x 2.7 cm. Normal sonographic  appearance.  Left ovary: Measures 3.1 x 2.1 x 2.8 cm. Normal sonographic  appearance.  Other Findings: Small amount of free fluid.  IMPRESSION:  5 cm intramural fibroid that does abut the endometrium.   MRI 07/08/12 Findings: The uterus demonstrates a sagittal length of 12 cm, width  of 7.6 cm and depth of 5.7 cm. (270 ml). A focal fibroid is  identified in the right lateral mid body and measures 4.5 x 4.9 by  5.0 cm (57 ml). This has a partial, approximately 25% submucosal  component, best imaged on axial image 13, series 4. A second  fibroid is seen in the fundal region measuring 2.7 x 1.8 by 2.7 cm  and has a small partial subserosal component. Both fibroids  demonstrate enhancement following the administration of intravenous  contrast.  Focal thickening of the transitional zone is seen in the posterior  upper uterine segment measuring 25 mm and this is suggestive of an  area of focal  adenomyosis. despite the lack of demonstration of  associated mural cystic change.  No focal endometrial abnormality is seen. The cervix, vagina,  bladder and urethra have a normal appearance. Both ovaries have a  normal appearance and contain several follicles.  A small amount of fluid is noted in the cul-de-sac extending into  the adnexal regions.  Some shotty pelvic adenopathy is noted but no pathologically  enlarged nodes are seen with the largest having a short-axis width  of 8.3 mm (axial image 13, series 4). These are likely reactive.  IMPRESSION:  Fibroid uterus with the one large fibroid having a significant  submucosal component as described above.  Findings suspicious for focal adenomyosis.   Assessment:   Uterine fibroids- symptomatic. For operative removal  Plan:   For laparoscopic assisted myomectomy today reviewed with pt case and risks and alternatives to the procedure  Munirah Doerner L. Harraway-Smith, M.D., [facility]

## 2012-07-12 NOTE — Op Note (Signed)
07/12/2012  5:26 PM  PATIENT:  Floy Sabina  26 y.o. female  PRE-OPERATIVE DIAGNOSIS:  Dysfuntional Uterine Bleeding, Fibroids & Anemia  POST-OPERATIVE DIAGNOSIS:  Dysfuntional Uterine Bleeding, Fibroids & Anemia  PROCEDURE:  Procedure(s) (LRB) with comments: ROBOTIC ASSISTED MYOMECTOMY (N/A)  SURGEON:  Surgeon(s) and Role:    * Willodean Rosenthal, MD - Primary    * Allie Bossier, MD - Assisting   ANESTHESIA:   general  EBL:  Total I/O In: 2840 [P.O.:240; I.V.:2600] Out: 780 [Urine:630; Blood:150]  BLOOD ADMINISTERED:none  DRAINS: none   LOCAL MEDICATIONS USED:  MARCAINE     SPECIMEN:  Source of Specimen:  Uterine fibroid  DISPOSITION OF SPECIMEN:  PATHOLOGY  COUNTS:  YES  TOURNIQUET:  * No tourniquets in log *  DICTATION: .Note written in EPIC  PLAN OF CARE: discharge to home after 6 hours observation  PATIENT DISPOSITION:  PACU - hemodynamically stable.   Delay start of Pharmacological VTE agent (>24hrs) due to surgical blood loss or risk of bleeding: not applicable     The risks, benefits, and alternatives of surgery were explained, understood, and accepted. Consents were signed. All questions were answered. She was taken to the operating room and general anesthesia was applied without complication. She was placed in the dorsal lithotomy position and her abdomen and vagina were prepped and draped after she had been carefully positioned on the table. A bimanual exam revealed a 14 week size uterus that was mobile. Her adnexa were not enlarged. The cervix was measured and the uterus was sounded to 8 cm. A Rumi uterine manipulator without the KOH ring was placed without difficulty. A Foley catheter was placed and it drained clear throughout the case. Gloves were changed and attention was turned to the abdomen. A Veress needle was placed intraperitoneally through the umbilicus. CO2 was used to insufflate the abdomen to approximately 4 L. After good  pneumoperitoneum was established, a 8 mm trocar was placed in the left upper quadrant. Laparoscopy confirmed correct placement. She was placed in Trendelenburg position and ports were placed in appropriate positions on her abdomen to allow maximum exposure during the robotic case. Specifically there was an 12 mm assistant port placed supraumbilically under direct laproscopic visualization. 2 8 mm ports were placed 10cm lateral and inferior to the midline port. And a  Forth 8mm port was placed in the right lower quadrant 8 cm from the right sided port. These were all placed under direct laparoscopic visualization.  The robot was attached to the ports and I proceeded with a robotic portion of the case. The pelvis was inspected.  There was a 5 cm fibroid noted in the right lower portion of the uterus and a smaller ~2cm fibroid noted at the fundus. The remainder of her pelvis appeared normal. Using the curved bipolar scissors, a transverse incision was made across the fibroid.  The uterus was carfully dissected around the fibroid using blunt dissection and bipolar energy.  Following removal of the lower fibroid, attention was turned to the fundal fibroid which was also incised in a transverse fashion and the uterus was dissected away from the fibroid.  Excellent hemostasis was noted.  The uterine bed was repaired in layers with 2-0 v-loc suture. The pelvis was irrigated and found to be hemostatic.  The robot was undocked and the midline supraumbilical port was extended to accommodate the 15mm Gynecare morcellator. The fibroids were removed through morcellation without difficulty and excellent hemostasis was noted.  Interceed was placed on  the incision sites.  The CO2 was allowed to escape from the abdomen and the remainder of the ports were removed. The fascia at the umbilicus was elevated and closed in a figure-of-eight fashion with a 0 Vicryl suture. A subcuticular closure was done at all incision sites with 4-0  Vicryl suture. The ports site were all injected with 0.5% Marcaine. The patient was extubated and taken to recovery in stable condition. Sponge, lap and needle counts were correct x 2.   There were no complications.  Jodell Weitman L. Harraway-Smith, M.D., [facility]

## 2012-07-12 NOTE — OR Nursing (Signed)
MD consented with CRNA and Anesthesia, Dr. Jean Rosenthal to bring pt back to OR room 7 without cross and match complete.

## 2012-07-12 NOTE — Progress Notes (Signed)
Pt. Discharged in the care of Mother. Stable lapsites are clean and dry X5 Voiding without difficulty. Small amt of vaginal bleeding noted on V-pad. Discharged instriuction given with good understanding. Question asked and answered.

## 2012-07-13 ENCOUNTER — Encounter (HOSPITAL_COMMUNITY): Payer: Self-pay | Admitting: Obstetrics & Gynecology

## 2012-07-22 ENCOUNTER — Telehealth: Payer: Self-pay | Admitting: *Deleted

## 2012-07-22 ENCOUNTER — Encounter: Payer: Self-pay | Admitting: *Deleted

## 2012-07-22 NOTE — Telephone Encounter (Signed)
Called pt to ask questions about the form for medical clearance to return to work. I stated that normally pts receive clearance to return to work following their post op appt. Her appt is 07/29/12 @ 1315. She stated that because of her disability benefits, she will lose her job if she does not return to work on 07/27/12. She has a sit-down job and does not do any lifting or other strenuous, physical work activity.  I obtained permission from Dr. Erin Fulling for pt to return to work on 07/27/12 as desired. Pt voiced understanding. Medical clearance form completed and faxed to pt's employer along with return to work letter as requested.

## 2012-07-29 ENCOUNTER — Encounter: Payer: Self-pay | Admitting: Obstetrics & Gynecology

## 2012-07-29 ENCOUNTER — Ambulatory Visit (INDEPENDENT_AMBULATORY_CARE_PROVIDER_SITE_OTHER): Payer: 59 | Admitting: Obstetrics & Gynecology

## 2012-07-29 VITALS — BP 126/86 | HR 73 | Temp 97.6°F | Resp 12 | Ht 68.0 in | Wt 281.3 lb

## 2012-07-29 DIAGNOSIS — Z3009 Encounter for other general counseling and advice on contraception: Secondary | ICD-10-CM

## 2012-07-29 DIAGNOSIS — Z9889 Other specified postprocedural states: Secondary | ICD-10-CM

## 2012-07-29 DIAGNOSIS — Z09 Encounter for follow-up examination after completed treatment for conditions other than malignant neoplasm: Secondary | ICD-10-CM

## 2012-07-29 LAB — TYPE AND SCREEN
ABO/RH(D): A POS
Antibody Screen: POSITIVE

## 2012-07-29 MED ORDER — NORGESTIMATE-ETH ESTRADIOL 0.25-35 MG-MCG PO TABS: 1.0000 | ORAL_TABLET | Freq: Every day | ORAL | Status: DC

## 2012-07-29 NOTE — Patient Instructions (Signed)

## 2012-07-29 NOTE — Progress Notes (Signed)
Subjective:     Patient ID: Yvonne [name], female   DOB: 1986/02/16, 26 y.o.   MRN: [id number]  HPI Pt with h/o robot assisted myomectomy 3 weeks prev.  Pt denies problems.  Reports that she had a lot of gas post op in the first week other wise has had no pain.  Went back to work last week.   Pt sexually active want to begin contraception.  Was previously on Depo Provera.  Wants to start OCPs.      Review of Systems     Objective:   Physical ExamBP 126/86  Pulse 73  Temp 97.6 F (36.4 C) (Oral)  Resp 12  Ht 5\' 8"  (1.727 m)  Wt 281 lb 4.8 oz (127.597 kg)  BMI 42.77 kg/m2  LMP 06/25/2012  Abd: soft; NT; ND.  Port sites healing well.     Assessment:     Contraception counseling- desires OCP's no contraindications Post op check- doing well    Plan:     Begin Sprintec 1 po q day F/u 3 months for BP check Return to full activities post op  Ashlye Oviedo L. Harraway-Smith, M.D., [facility]

## 2013-04-29 ENCOUNTER — Ambulatory Visit (INDEPENDENT_AMBULATORY_CARE_PROVIDER_SITE_OTHER): Payer: 59 | Admitting: Obstetrics & Gynecology

## 2013-04-29 ENCOUNTER — Encounter: Payer: Self-pay | Admitting: Obstetrics & Gynecology

## 2013-04-29 VITALS — BP 117/77 | HR 87 | Temp 98.4°F | Ht 68.0 in | Wt 291.0 lb

## 2013-04-29 DIAGNOSIS — N949 Unspecified condition associated with female genital organs and menstrual cycle: Secondary | ICD-10-CM

## 2013-04-29 DIAGNOSIS — N938 Other specified abnormal uterine and vaginal bleeding: Secondary | ICD-10-CM

## 2013-04-29 LAB — TSH: TSH: 4.331 u[IU]/mL (ref 0.350–4.500)

## 2013-04-29 LAB — POCT URINALYSIS DIP (DEVICE)
Bilirubin Urine: NEGATIVE
Glucose, UA: NEGATIVE mg/dL
Hgb urine dipstick: NEGATIVE
Leukocytes, UA: NEGATIVE
Nitrite: NEGATIVE
Urobilinogen, UA: 0.2 mg/dL (ref 0.0–1.0)

## 2013-04-29 LAB — HEMOGLOBIN A1C
Hgb A1c MFr Bld: 4.9 % (ref <5.7)
Mean Plasma Glucose: 94 mg/dL (ref <117)

## 2013-04-29 NOTE — Progress Notes (Signed)
  Subjective:    Patient ID: Floy Sabina, female    DOB: 1986/08/28, 28 y.o.   MRN: [id number]  HPI  Ms. Mayford Knife is here because her period length has increased with about 3-4 days added with old dark blood. This was not an issue until she stopped her OCPs. She stopped her OCPs because the one she was prescribed was not covered by her insurance company. She did not bring the list of approved OCPs with her today.  Review of Systems Pap normal 2013    Objective:   Physical Exam   Normal appearing small cervix, normal discharge     Assessment & Plan:  Lengthening menstrual cycle- She will call back to the clinic with a list of OCPs approved by her insurance company and one will be called to her pharmacy. Morbid obesity- check HBA1C Check cervical cultures and TSH

## 2013-04-29 NOTE — Addendum Note (Signed)
Addended by: Franchot Mimes on: 04/29/2013 10:42 AM   Modules accepted: Orders

## 2013-06-02 ENCOUNTER — Encounter: Payer: 59 | Admitting: Obstetrics & Gynecology

## 2013-06-03 NOTE — Progress Notes (Signed)
This encounter was created in error - please disregard.

## 2013-07-15 ENCOUNTER — Encounter: Payer: Self-pay | Admitting: Obstetrics & Gynecology

## 2013-07-15 ENCOUNTER — Ambulatory Visit (INDEPENDENT_AMBULATORY_CARE_PROVIDER_SITE_OTHER): Payer: 59 | Admitting: Obstetrics & Gynecology

## 2013-07-15 VITALS — BP 115/70 | HR 81 | Temp 97.2°F | Ht 68.0 in | Wt 292.0 lb

## 2013-07-15 DIAGNOSIS — Z3009 Encounter for other general counseling and advice on contraception: Secondary | ICD-10-CM | POA: Insufficient documentation

## 2013-07-15 DIAGNOSIS — Z01419 Encounter for gynecological examination (general) (routine) without abnormal findings: Secondary | ICD-10-CM

## 2013-07-15 DIAGNOSIS — Z3041 Encounter for surveillance of contraceptive pills: Secondary | ICD-10-CM

## 2013-07-15 MED ORDER — NORETHINDRONE ACET-ETHINYL EST 1-20 MG-MCG PO TABS: 1.0000 | ORAL_TABLET | Freq: Every day | ORAL | Status: DC

## 2013-07-15 NOTE — Patient Instructions (Addendum)
It was nice to see you today.  We will send you a letter regarding your pap smear results. I have sent in a Prescription for your new birth control pill - Junel.   Oral Contraception Use Oral contraceptives (OCs) are medicines taken to prevent pregnancy. OCs work by preventing the ovaries from releasing eggs. The hormones in OCs also cause the cervical mucus to thicken, preventing the sperm from entering the uterus. The hormones also cause the uterine lining to become thin, not allowing a fertilized egg to attach to the inside of the uterus. OCs are highly effective when taken exactly as prescribed. However, OCs do not prevent sexually transmitted diseases (STDs). Safe sex practices, such as using condoms along with an OC, can help prevent STDs.  Before taking OCs, you may have a physical exam and Pap test. Your caregiver may also order blood tests if necessary. Your caregiver will make sure you are a good candidate for oral contraception. Discuss with your caregiver the possible side effects of the OC you may be prescribed. When starting an OC, it can take 2 to 3 months for the body to adjust to the changes in hormone levels in your body.  HOW TO TAKE ORAL CONTRACEPTIVES Your caregiver may advise you on how to start taking the first cycle of OCs. Otherwise, you can:  Start on day 1 of your menstrual period. You will not need any backup contraceptive protection with this start time.  Start on the first Sunday after your menstrual period or the day you get your prescription. In these cases, you will need to use backup contraceptive protection for the first 7-day cycle. After you have started taking OCs:  If you forget to take 1 pill, take it as soon as you remember. Take the next pill at the regular time.  If you miss 2 or more pills, use backup birth control until your next menstrual period starts.  If you use a 28-day pack that contains inactive pills and you miss 1 of the last 7 pills (pills with  no hormones), it will not matter. Throw away the rest of the non-hormone pills and start a new pill pack. No matter which day you start the OC, you will always start a new pack on that same day of the week. Have an extra pack of OCs and a backup contraceptive method available in case you miss some pills or lose your OC pack. HOME CARE INSTRUCTIONS   Do not smoke.  Always use a condom to protect against STDs. OCs do not protect against STDs.  Use a calendar to mark your menstrual period days.  Read the information and directions that come with your OC. Talk to your caregiver if you have questions. SEEK MEDICAL CARE IF:   You develop nausea and vomiting.  You have abnormal vaginal discharge or bleeding.  You develop a rash.  You miss your menstrual period.  You are losing your hair.  You need treatment for mood swings or depression.  You get dizzy when taking the OC.  You develop acne from taking the OC.  You become pregnant. SEEK IMMEDIATE MEDICAL CARE IF:   You develop chest pain.  You develop shortness of breath.  You have an uncontrolled or severe headache.  You develop numbness or slurred speech.  You develop visual problems.  You develop pain, redness, and swelling in the legs. Document Released: 09/18/2011 Document Revised: 12/22/2011 Document Reviewed: 09/18/2011 [facility] Patient Information 2014 [place], [place].

## 2013-07-15 NOTE — Assessment & Plan Note (Signed)
Patient doing well with no issues/complaints.  Pap smear and breast exam performed today.

## 2013-07-15 NOTE — Progress Notes (Signed)
Subjective:     Patient ID: Yvonne [name], female   DOB: 08/13/1986, 27 y.o.   MRN: [id number]  HPI 27 year old female presents to [facility] for an annual visit and to discuss contraceptives.  Patient reports that she is doing well.  She has no complaints today.  She would like to restart OCP's today (she brought a list of covered medications) and also have her annual physical exam and pap smear.   Review of Systems  Constitutional: Negative for fever and chills.  Gastrointestinal: Negative for abdominal pain.  Genitourinary: Negative for dysuria, vaginal discharge and difficulty urinating.      Objective:   Physical Exam Filed Vitals:   07/15/13 0828  BP: 115/70  Pulse: 81  Temp: 97.2 F (36.2 C)  Exam: General: well appearing female in NAD.  Cardiovascular: RRR. No murmurs, rubs, or gallops. Respiratory: CTAB. No rales, rhonchi, or wheeze. Abdomen: obese, soft, nontender, nondistended.  Scars noted from prior surgery.  Breast: Breasts: breasts appear normal, no appreciable masses, no skin or nipple changes or axillary nodes. Pelvic Exam:        External: normal female genitalia without lesions or masses        Vagina: normal without lesions or masses        Cervix: small cervix; no lesions appreciated.         Adnexa: normal bimanual exam without masses or fullness        Uterus: normal by palpation        Pap smear: performed    Assessment:     See Problem List.     Plan:    Pt for annual GYN exam with contraception counseling for OCP's.  No problems.   F/u for PAP F/u 1 year or sooner prn  Carolyn L. Harraway-Smith, M.D., [facility]

## 2013-07-15 NOTE — Assessment & Plan Note (Signed)
Patient requesting new OCP today. Rx sent for Junel.

## 2013-10-13 DIAGNOSIS — A6000 Herpesviral infection of urogenital system, unspecified: Secondary | ICD-10-CM

## 2013-10-13 HISTORY — DX: Herpesviral infection of urogenital system, unspecified: A60.00

## 2014-08-14 ENCOUNTER — Encounter: Payer: Self-pay | Admitting: Obstetrics & Gynecology

## 2015-04-20 ENCOUNTER — Telehealth: Payer: Self-pay | Admitting: Hematology

## 2015-04-20 NOTE — Telephone Encounter (Signed)
new patient appt-s/w patient and gave np appt for 07/12 @ 8:30 w/Dr. Irene Limbo Referring Dr. Janyth Pupa Dx- Anemia

## 2015-04-20 NOTE — Telephone Encounter (Signed)
Referral information scanned

## 2015-04-24 ENCOUNTER — Encounter: Payer: Self-pay | Admitting: Hematology

## 2015-04-24 ENCOUNTER — Ambulatory Visit (HOSPITAL_BASED_OUTPATIENT_CLINIC_OR_DEPARTMENT_OTHER): Payer: Managed Care, Other (non HMO) | Admitting: Hematology

## 2015-04-24 ENCOUNTER — Ambulatory Visit (HOSPITAL_BASED_OUTPATIENT_CLINIC_OR_DEPARTMENT_OTHER): Payer: Managed Care, Other (non HMO)

## 2015-04-24 ENCOUNTER — Ambulatory Visit: Payer: Managed Care, Other (non HMO)

## 2015-04-24 ENCOUNTER — Telehealth: Payer: Self-pay | Admitting: Hematology

## 2015-04-24 DIAGNOSIS — D500 Iron deficiency anemia secondary to blood loss (chronic): Secondary | ICD-10-CM | POA: Diagnosis not present

## 2015-04-24 LAB — CBC & DIFF AND RETIC
BASO%: 0.6 % (ref 0.0–2.0)
BASOS ABS: 0.1 10*3/uL (ref 0.0–0.1)
EOS%: 1.7 % (ref 0.0–7.0)
Eosinophils Absolute: 0.2 10*3/uL (ref 0.0–0.5)
HEMATOCRIT: 27.1 % — AB (ref 34.8–46.6)
HGB: 6.8 g/dL — CL (ref 11.6–15.9)
Immature Retic Fract: 38.6 % — ABNORMAL HIGH (ref 1.60–10.00)
LYMPH#: 3.4 10*3/uL — AB (ref 0.9–3.3)
LYMPH%: 28.3 % (ref 14.0–49.7)
MCH: 15.5 pg — ABNORMAL LOW (ref 25.1–34.0)
MCHC: 25.1 g/dL — AB (ref 31.5–36.0)
MCV: 61.7 fL — AB (ref 79.5–101.0)
MONO#: 0.7 10*3/uL (ref 0.1–0.9)
MONO%: 5.7 % (ref 0.0–14.0)
NEUT%: 63.7 % (ref 38.4–76.8)
NEUTROS ABS: 7.7 10*3/uL — AB (ref 1.5–6.5)
Platelets: 305 10*3/uL (ref 145–400)
RBC: 4.39 10*6/uL (ref 3.70–5.45)
RDW: 19.5 % — AB (ref 11.2–14.5)
RETIC %: 1.89 % (ref 0.70–2.10)
RETIC CT ABS: 82.97 10*3/uL (ref 33.70–90.70)
WBC: 12.1 10*3/uL — ABNORMAL HIGH (ref 3.9–10.3)
nRBC: 0 % (ref 0–0)

## 2015-04-24 LAB — COMPREHENSIVE METABOLIC PANEL (CC13)
ALBUMIN: 3.9 g/dL (ref 3.5–5.0)
ALK PHOS: 68 U/L (ref 40–150)
ALT: 16 U/L (ref 0–55)
AST: 16 U/L (ref 5–34)
Anion Gap: 6 mEq/L (ref 3–11)
BUN: 11.2 mg/dL (ref 7.0–26.0)
CO2: 24 meq/L (ref 22–29)
CREATININE: 0.9 mg/dL (ref 0.6–1.1)
Calcium: 9.5 mg/dL (ref 8.4–10.4)
Chloride: 107 mEq/L (ref 98–109)
EGFR: >90 mL/min/{1.73_m2} (ref >90)
GLUCOSE: 97 mg/dL (ref 70–140)
Potassium: 4.2 mEq/L (ref 3.5–5.1)
SODIUM: 138 meq/L (ref 136–145)
TOTAL PROTEIN: 7.4 g/dL (ref 6.4–8.3)
Total Bilirubin: 0.58 mg/dL (ref 0.20–1.20)

## 2015-04-24 LAB — FERRITIN CHCC: FERRITIN: 6 ng/mL — AB (ref 9–269)

## 2015-04-24 LAB — IRON AND TIBC CHCC
%SAT: 2 % — AB (ref 21–57)
Iron: 11 ug/dL — ABNORMAL LOW (ref 41–142)
TIBC: 474 ug/dL — ABNORMAL HIGH (ref 236–444)
UIBC: 462 ug/dL — AB (ref 120–384)

## 2015-04-24 LAB — SEDIMENTATION RATE: Sed Rate: 5 mm/hr (ref 0–20)

## 2015-04-24 LAB — LACTATE DEHYDROGENASE (CC13): LDH: 196 U/L (ref 125–245)

## 2015-04-24 LAB — VITAMIN B12: VITAMIN B 12: 683 pg/mL (ref 211–911)

## 2015-04-24 NOTE — Assessment & Plan Note (Addendum)
Patient has significant microcytic anemia with an MCV of 61 and hemoglobin of 6.8 today. This is predominantly due to iron deficiency with a ferritin of 6 and iron saturation 2%. LDH is normal which rules out any significant hemolysis this time. Cannot rule out an underlying thalassemia or hemoglobinopathy contributing to the patient's microcytosis especially given her history of childhood anemia and family history of in her mother and brother and the fact that she is of African descent.   Iron deficiency appears to be primarily due to heavy menstrual losses. No evidence of other overt blood loss. Patient notes that she has never had any previous issues with GI bleeding or ulcers and this EGD or colonoscopy.  Plan - patient's extent of anemia which is somewhat symptomatic and severe iron deficiency and the fact that she has poor tolerance to oral iron makes her a good candidate for receiving IV iron replacement. -After comprehensive assessment and counseling decided to hold off on a blood transfusion at this time given the patient's limited symptoms and the chronicity of her anemia and the fact that we expect it  to respond well to aggressive  iron replacement. -Orders have been placed for IV Feraheme 510 mg every week x 3 doses with appropriate premeds and nursing administration visit has been requested for 04/30/2015 with clinic visit for reassessment. -Patient will continue to follow with her GYN doctors to address the etiology of her menorrhagia. She has been recently started on Tranexamic acid to help reduce menstrual losses.  -After IV iron I  see her back in 4-6 weeks to assess response to IV iron with CBC, CMP, reticulocyte count, ferritin. -The patient remains anemic or microcytic at that time we will consider working her up for thalassemia/hemoglobinopathies.

## 2015-04-24 NOTE — Telephone Encounter (Signed)
Pt confirmed labs/ov per 07/12 POF, gave pt AVS and Calendar... KJ °

## 2015-04-24 NOTE — Assessment & Plan Note (Signed)
.  Body mass index is 44.42 kg/(m^2). Patient was counseled on need for repeated diet and exercise to target a healthier weight. This might also help her improve her fertility and pregnancy outcomes since she is actively trying to get pregnant. She sounds motivated to work on this after her fatigue is improved with correction of anemia. Plan -Design the diet and exercise plan with your primary care physician after correction of anemia. -Discussed with PCP about considering a sleep study to diagnose sleep apnea given your symptoms.

## 2015-04-24 NOTE — Progress Notes (Signed)
Checked in new pt with no financial concerns. °

## 2015-04-24 NOTE — Patient Instructions (Addendum)
-Labs draw today -will call you with results and will need to setup IV iron infusion if significant Iron deficiency is noted on labs. -Return to clinic in 1 week.  Iron Deficiency Anemia Anemia is a condition in which there are less red blood cells or hemoglobin in the blood than normal. Hemoglobin is the part of red blood cells that carries oxygen. Iron deficiency anemia is anemia caused by too little iron. It is the most common type of anemia. It may leave you tired and short of breath. CAUSES   Lack of iron in the diet.  Poor absorption of iron, as seen with intestinal disorders.  Intestinal bleeding.  Heavy periods. SIGNS AND SYMPTOMS  Mild anemia may not be noticeable. Symptoms may include:  Fatigue.  Headache.  Pale skin.  Weakness.  Tiredness.  Shortness of breath.  Dizziness.  Cold hands and feet.  Fast or irregular heartbeat. DIAGNOSIS  Diagnosis requires a thorough evaluation and physical exam by your health care provider. Blood tests are generally used to confirm iron deficiency anemia. Additional tests may be done to find the underlying cause of your anemia. These may include:  Testing for blood in the stool (fecal occult blood test).  A procedure to see inside the colon and rectum (colonoscopy).  A procedure to see inside the esophagus and stomach (endoscopy). TREATMENT  Iron deficiency anemia is treated by correcting the cause of the deficiency. Treatment may involve:  Adding iron-rich foods to your diet.  Taking iron supplements. Pregnant or breastfeeding women need to take extra iron because their normal diet usually does not provide the required amount.  Taking vitamins. Vitamin C improves the absorption of iron. Your health care provider may recommend that you take your iron tablets with a glass of orange juice or vitamin C supplement.  Medicines to make heavy menstrual flow lighter.  Surgery. HOME CARE INSTRUCTIONS   Take iron as directed by  your health care provider.  If you cannot tolerate taking iron supplements by mouth, talk to your health care provider about taking them through a vein (intravenously) or an injection into a muscle.  For the best iron absorption, iron supplements should be taken on an empty stomach. If you cannot tolerate them on an empty stomach, you may need to take them with food.  Do not drink milk or take antacids at the same time as your iron supplements. Milk and antacids may interfere with the absorption of iron.  Iron supplements can cause constipation. Make sure to include fiber in your diet to prevent constipation. A stool softener may also be recommended.  Take vitamins as directed by your health care provider.  Eat a diet rich in iron. Foods high in iron include liver, lean beef, whole-grain bread, eggs, dried fruit, and dark green leafy vegetables. SEEK IMMEDIATE MEDICAL CARE IF:   You faint. If this happens, do not drive. Call your local emergency services (911 in [place] if no other help is available.  You have chest pain.  You feel nauseous or vomit.  You have severe or increased shortness of breath with activity.  You feel weak.  You have a rapid heartbeat.  You have unexplained sweating.  You become light-headed when getting up from a chair or bed. MAKE SURE YOU:   Understand these instructions.  Will watch your condition.  Will get help right away if you are not doing well or get worse. Document Released: 09/26/2000 Document Revised: 10/04/2013 Document Reviewed: 06/06/2013 Exit[employer] Patient Information 2015  [employer]. This information is not intended to replace advice given to you by your health care provider. Make sure you discuss any questions you have with your health care provider.

## 2015-04-24 NOTE — Progress Notes (Signed)
[facility]   CONSULT NOTE  Patient Care Team: Osborne Oman, MD as PCP - General (Obstetrics and Gynecology) Brunetta Genera, MD as Consulting Physician (Hematology)  CHIEF COMPLAINTS/PURPOSE OF CONSULTATION:  Lightheadedness and fatigue/ " I am anemic"  HISTORY OF PRESENTING ILLNESS:  Yvonne [name] 29 y.o. AA female with [place] for evaluation and management of significant anemia.  Patient notes that she has always been anemic. She notes that she was told that she had anemia even as a child but no specific diagnosis was made. She has had heavy periods throughout her teenage years and into her 90s. The years ago her periods were very heavy and she was noted to have fibroids for which she had a robotic laparoscopic myomectomy. She notes that her menstrual losses improved after surgery for a while but have been increasing again. She notes that her periods last 7 days with the initial 3 days having heavy flow with clots. She has tried OCPs in the past but is currently trying to get pregnant. She started on lysteda to help control her menstrual bleeding.  Denies any other overt evidence of blood loss. No epistaxis no blood in the urine no blood in the stools.  She was found to have abnormal CBC from 04/13/2015 with her primary care physician that showed a hemoglobin of 6.6. CBC from today shows a hemoglobin of 6.8 with an MCV of 61 and elevated RDW of 19.5. Her ferritin levels are 6 and iron saturation is 2%.  She denies recent chest pain on exertion, shortness of breath on minimal exertion.  Notes mild dizziness when she gets up suddenly. The patient denies over the counter NSAID ingestion. She is not on antiplatelets agents. She had no prior history or diagnosis of cancer. Her age appropriate screening programs are up-to-date. She notes pica symptoms in the form of ice cravings. She also notes nail changes in the form off brittle nails that crack easily and increased hair loss.  She never donated  blood or received blood transfusion.  The patient was prescribed oral iron supplements about 3 years ago and took them for several months. Stop ferrous sulfate last 2 years has tried some over-the-counter slow release preparations she tolerates better. Patient notes that the ferrous sulfate for significant GI distress, constipation nausea and unacceptable metallic taste in her mouth.  She is quite agreeable to the idea of getting IV iron replacement if needed. She has never had IV iron in the past.     MEDICAL HISTORY:  Past Medical History  Diagnosis Date  . Spasm of lung air passages   . Fibroids   . Obesity, Class III, BMI 40-49.9 (morbid obesity)     BMI 40  . Anemia   . Shortness of breath     "sometimes when I have a cold"  . Recurrent UTI (urinary tract infection)   . Patient Active Problem List   Diagnosis Date Noted  . Iron deficiency anemia due to chronic blood loss 04/24/2015  . Well woman exam with routine gynecological exam 07/15/2013  . Counseling for birth control, oral contraceptives 07/15/2013  . Fibroids 02/05/2012  . Menorrhagia 02/05/2012  . Symptomatic anemia due to menorrhagia 02/05/2012  . Red blood cell antibody positive 02/05/2012     SURGICAL HISTORY: Past Surgical History  Procedure Laterality Date  . Left arm surgey    . Therapeutic abortion    . Robot assisted myomectomy  07/12/2012    Procedure: ROBOTIC ASSISTED MYOMECTOMY;  Surgeon: Hoyle Sauer  Harraway-Smith, MD;  Location: [facility];  Service: Gynecology;  Laterality: N/A;    SOCIAL HISTORY: History   Social History  . Marital Status: Single    Spouse Name: N/A  . Number of Children: N/A  . Years of Education: N/A   Occupational History  . Not on file.   Social History Main Topics  . Smoking status: Current Every Day Smoker -- 0.25 packs/day    Types: Cigarettes  . Smokeless tobacco: Never Used  . Alcohol Use: Yes     Comment: occasionally  . Drug Use: Yes    Special: Marijuana      Comment: patient states, "once in a while per month"  . Sexual Activity: Not Currently    Birth Control/ Protection: None, Condom   Other Topics Concern  . Not on file   Social History Narrative    FAMILY HISTORY: Family History  Problem Relation Age of Onset  . Fibroids Mother   . Hypertension Mother     ALLERGIES:  has No Known Allergies.  MEDICATIONS:  Current Outpatient Prescriptions  Medication Sig Dispense Refill  . ibuprofen (ADVIL,MOTRIN) 600 MG tablet Take 1 tablet (600 mg total) by mouth every 6 (six) hours as needed for pain. 30 tablet 0  . Specialty Vitamins Products (VITAMINS FOR HAIR PO) Take by mouth.    . tranexamic acid (LYSTEDA) 650 MG TABS tablet Take 1,300 mg by mouth 3 (three) times daily.    . brompheniramine-pseudoephedrine-DM 30-2-10 MG/5ML syrup TK 10 MLS PO Q 4 TO 6 H  0  . ferrous sulfate (FERROUSUL) 325 (65 FE) MG tablet Take 1 tablet (325 mg total) by mouth 3 (three) times daily with meals. 90 tablet 11  . fluticasone (FLONASE) 50 MCG/ACT nasal spray INT 1 TO 2 SPRAYS QD  0  .      . ondansetron (ZOFRAN-ODT) 4 MG disintegrating tablet DISSOLVE 1 IN MOUTH  Q 4 TO 6 H PRN  0   No current facility-administered medications for this visit.    REVIEW OF SYSTEMS:   Constitutional: Denies fevers, chills or abnormal night sweats Eyes: Denies blurriness of vision, double vision or watery eyes Ears, nose, mouth, throat, and face: Denies mucositis or sore throat Respiratory: Denies cough, dyspnea or wheezes Cardiovascular: Denies palpitation, chest discomfort or lower extremity swelling Gastrointestinal:  Denies nausea, heartburn or change in bowel habits Skin: Denies abnormal skin rashes Lymphatics: Denies new lymphadenopathy or easy bruising Neurological:Denies numbness, tingling or new weaknesses Behavioral/Psych: Mood is stable, no new changes  All other systems were reviewed with the patient and are negative.  PHYSICAL EXAMINATION: ECOG  PERFORMANCE STATUS: 1 - Symptomatic but completely ambulatory  Filed Vitals:   04/24/15 0859  BP: 128/71  Pulse: 105  Temp: 99.6 F (37.6 C)  Resp: 18   Filed Weights   04/24/15 0859  Weight: 292 lb 1.6 oz (132.496 kg)    GENERAL:alert, no distress and comfortable pallor SKIN: skin color, texture, turgor are normal, no rashes or significant lesions EYES: normal, conjunctivae with pallor noted no scleral icterus  OROPHARYNX:no exudate, no erythema and lips, buccal mucosa, and tongue normal  NECK: supple, thyroid normal size, non-tender, without nodularity LYMPH:  no palpable lymphadenopathy in the cervical, axillary or inguinal LUNGS: clear to auscultation and percussion with normal breathing effort HEART: regular rate & rhythm and no murmurs and no lower extremity edema ABDOMEN:abdomen obese, soft, non-tender and normal bowel sounds. No palpable hepatosplenomegaly Musculoskeletal:no cyanosis of digits and no clubbing  PSYCH:  alert & oriented x 3 with fluent speech NEURO: no focal motor/sensory deficits  LABORATORY DATA:  I have reviewed the data as listed Recent Results (from the past 2160 hour(s))  CBC & Diff and Retic     Status: Abnormal   Collection Time: 04/24/15 10:06 AM  Result Value Ref Range   WBC 12.1 (H) 3.9 - 10.3 10e3/uL   NEUT# 7.7 (H) 1.5 - 6.5 10e3/uL   HGB 6.8 (LL) 11.6 - 15.9 g/dL   HCT 27.1 (L) 34.8 - 46.6 %   Platelets 305 145 - 400 10e3/uL   MCV 61.7 (L) 79.5 - 101.0 fL   MCH 15.5 (L) 25.1 - 34.0 pg   MCHC 25.1 (L) 31.5 - 36.0 g/dL   RBC 4.39 3.70 - 5.45 10e6/uL   RDW 19.5 (H) 11.2 - 14.5 %   lymph# 3.4 (H) 0.9 - 3.3 10e3/uL   MONO# 0.7 0.1 - 0.9 10e3/uL   Eosinophils Absolute 0.2 0.0 - 0.5 10e3/uL   Basophils Absolute 0.1 0.0 - 0.1 10e3/uL   NEUT% 63.7 38.4 - 76.8 %   LYMPH% 28.3 14.0 - 49.7 %   MONO% 5.7 0.0 - 14.0 %   EOS% 1.7 0.0 - 7.0 %   BASO% 0.6 0.0 - 2.0 %   nRBC 0 0 - 0 %   Retic % 1.89 0.70 - 2.10 %   Retic Ct Abs 82.97 33.70 -  90.70 10e3/uL   Immature Retic Fract 38.60 (H) 1.60 - 10.00 %  Comprehensive metabolic panel (Cmet) - [facility]     Status: None   Collection Time: 04/24/15 10:07 AM  Result Value Ref Range   Sodium 138 136 - 145 mEq/L   Potassium 4.2 3.5 - 5.1 mEq/L   Chloride 107 98 - 109 mEq/L   CO2 24 22 - 29 mEq/L   Glucose 97 70 - 140 mg/dl   BUN 11.2 7.0 - 26.0 mg/dL   Creatinine 0.9 0.6 - 1.1 mg/dL   Total Bilirubin 0.58 0.20 - 1.20 mg/dL   Alkaline Phosphatase 68 40 - 150 U/L   AST 16 5 - 34 U/L   ALT 16 0 - 55 U/L   Total Protein 7.4 6.4 - 8.3 g/dL   Albumin 3.9 3.5 - 5.0 g/dL   Calcium 9.5 8.4 - 10.4 mg/dL   Anion Gap 6 3 - 11 mEq/L   EGFR >90 >90 ml/min/1.73 m2    Comment: eGFR is calculated using the CKD-EPI Creatinine Equation (2009)  Ferritin     Status: Abnormal   Collection Time: 04/24/15 10:08 AM  Result Value Ref Range   Ferritin 6 (L) 9 - 269 ng/ml  Iron and TIBC [facility]     Status: Abnormal   Collection Time: 04/24/15 10:08 AM  Result Value Ref Range   Iron 11 (L) 41 - 142 ug/dL   TIBC 474 (H) 236 - 444 ug/dL   UIBC 462 (H) 120 - 384 ug/dL   %SAT 2 (L) 21 - 57 %  Lactate dehydrogenase     Status: None   Collection Time: 04/24/15 10:08 AM  Result Value Ref Range   LDH 196 125 - 245 U/L    RADIOGRAPHIC STUDIES: I have personally reviewed the radiological images as listed and agreed with the findings in the report. No results found.  ASSESSMENT & PLAN:  Iron deficiency anemia due to chronic blood loss Patient has significant microcytic anemia with an MCV of 61 and hemoglobin of 6.8 today. This is predominantly due  to iron deficiency with a ferritin of 6 and iron saturation 2%. LDH is normal which rules out any significant hemolysis this time. Cannot rule out an underlying thalassemia or hemoglobinopathy contributing to the patient's microcytosis especially given her history of childhood anemia and family history of in her mother and brother and the fact that she is of African  descent.   Iron deficiency appears to be primarily due to heavy menstrual losses. No evidence of other overt blood loss. Patient notes that she has never had any previous issues with GI bleeding or ulcers and this EGD or colonoscopy.  Plan - patient's extent of anemia which is somewhat symptomatic and severe iron deficiency and the fact that she has poor tolerance to oral iron makes her a good candidate for receiving IV iron replacement. -After comprehensive assessment and counseling decided to hold off on a blood transfusion at this time given the patient's limited symptoms and the chronicity of her anemia and the fact that we expect it  to respond well to aggressive  iron replacement. -Orders have been placed for IV Feraheme 510 mg every week x 3 doses with appropriate premeds and nursing administration visit has been requested for 04/30/2015 with clinic visit for reassessment. -Patient will continue to follow with her GYN doctors to address the etiology of her menorrhagia. She has been recently started on Tranexamic acid to help reduce menstrual losses.  -After IV iron I  see her back in 4-6 weeks to assess response to IV iron with CBC, CMP, reticulocyte count, ferritin. -The patient remains anemic or microcytic at that time we will consider working her up for thalassemia/hemoglobinopathies.   Morbid obesity .Body mass index is 44.42 kg/(m^2). Patient was counseled on need for repeated diet and exercise to target a healthier weight. This might also help her improve her fertility and pregnancy outcomes since she is actively trying to get pregnant. She sounds motivated to work on this after her fatigue is improved with correction of anemia. Plan -Design the diet and exercise plan with your primary care physician after correction of anemia. -Discussed with PCP about considering a sleep study to diagnose sleep apnea given your symptoms.     All questions were answered. The patient knows to  call the clinic with any problems, questions or concerns. I spent 40 minutes counseling the patient face to face. The total time spent in the appointment was 60 minutes and more than 50% was on counseling.     Brunetta Genera, MD 04/24/2015 2:44 PM

## 2015-04-25 ENCOUNTER — Telehealth: Payer: Self-pay | Admitting: *Deleted

## 2015-04-25 NOTE — Telephone Encounter (Signed)
Per staff message and POF I have scheduled appts. Advised scheduler of appts. JMW  

## 2015-04-30 ENCOUNTER — Ambulatory Visit: Payer: Managed Care, Other (non HMO)

## 2015-04-30 ENCOUNTER — Telehealth: Payer: Self-pay | Admitting: Hematology

## 2015-04-30 NOTE — Telephone Encounter (Signed)
Patient called today re 7/18 inf appointment. Patient was out of town and just retrieved message. Per patient appointment rescheduled to 7/19 and appointments for Monday 7/25, 8/1 and 9/12 moved to Tuesday. Patient will get new schedule when she comes in tomorrow. Patient has new d/t for 47/19 @ 7:30 am.

## 2015-05-01 ENCOUNTER — Ambulatory Visit (HOSPITAL_BASED_OUTPATIENT_CLINIC_OR_DEPARTMENT_OTHER): Payer: Managed Care, Other (non HMO)

## 2015-05-01 ENCOUNTER — Ambulatory Visit: Payer: Managed Care, Other (non HMO) | Admitting: Hematology

## 2015-05-01 VITALS — BP 105/48 | HR 75 | Temp 98.2°F | Resp 20

## 2015-05-01 DIAGNOSIS — D500 Iron deficiency anemia secondary to blood loss (chronic): Secondary | ICD-10-CM

## 2015-05-01 MED ORDER — ACETAMINOPHEN 325 MG PO TABS
650.0000 mg | ORAL_TABLET | Freq: Once | ORAL | Status: AC
  Administered 2015-05-01: 650 mg via ORAL

## 2015-05-01 MED ORDER — ACETAMINOPHEN 325 MG PO TABS
ORAL_TABLET | ORAL | Status: AC
  Filled 2015-05-01: qty 2

## 2015-05-01 MED ORDER — SODIUM CHLORIDE 0.9 % IV SOLN
Freq: Once | INTRAVENOUS | Status: AC
  Administered 2015-05-01: 08:00:00 via INTRAVENOUS

## 2015-05-01 MED ORDER — FERUMOXYTOL INJECTION 510 MG/17 ML
510.0000 mg | Freq: Once | INTRAVENOUS | Status: AC
  Administered 2015-05-01: 510 mg via INTRAVENOUS
  Filled 2015-05-01: qty 17

## 2015-05-01 MED ORDER — DIPHENHYDRAMINE HCL 50 MG/ML IJ SOLN
25.0000 mg | Freq: Once | INTRAMUSCULAR | Status: AC
  Administered 2015-05-01: 25 mg via INTRAVENOUS

## 2015-05-01 MED ORDER — DIPHENHYDRAMINE HCL 50 MG/ML IJ SOLN
INTRAMUSCULAR | Status: AC
  Filled 2015-05-01: qty 1

## 2015-05-01 NOTE — Patient Instructions (Signed)

## 2015-05-07 ENCOUNTER — Ambulatory Visit: Payer: Managed Care, Other (non HMO)

## 2015-05-07 ENCOUNTER — Other Ambulatory Visit: Payer: Self-pay | Admitting: Hematology

## 2015-05-08 ENCOUNTER — Ambulatory Visit (HOSPITAL_BASED_OUTPATIENT_CLINIC_OR_DEPARTMENT_OTHER): Payer: Managed Care, Other (non HMO)

## 2015-05-08 VITALS — BP 109/62 | HR 68 | Temp 98.6°F | Resp 17

## 2015-05-08 DIAGNOSIS — D500 Iron deficiency anemia secondary to blood loss (chronic): Secondary | ICD-10-CM | POA: Diagnosis not present

## 2015-05-08 MED ORDER — DIPHENHYDRAMINE HCL 50 MG/ML IJ SOLN
INTRAMUSCULAR | Status: AC
Start: 2015-05-08 — End: 2015-05-08
  Filled 2015-05-08: qty 1

## 2015-05-08 MED ORDER — SODIUM CHLORIDE 0.9 % IV SOLN
510.0000 mg | Freq: Once | INTRAVENOUS | Status: AC
  Administered 2015-05-08: 510 mg via INTRAVENOUS
  Filled 2015-05-08: qty 17

## 2015-05-08 MED ORDER — SODIUM CHLORIDE 0.9 % IV SOLN
Freq: Once | INTRAVENOUS | Status: AC
  Administered 2015-05-08: 08:00:00 via INTRAVENOUS

## 2015-05-08 MED ORDER — ACETAMINOPHEN 325 MG PO TABS
ORAL_TABLET | ORAL | Status: AC
  Filled 2015-05-08: qty 2

## 2015-05-08 MED ORDER — DIPHENHYDRAMINE HCL 50 MG/ML IJ SOLN
25.0000 mg | INTRAMUSCULAR | Status: DC
  Administered 2015-05-08: 25 mg via INTRAVENOUS

## 2015-05-08 MED ORDER — ACETAMINOPHEN 325 MG PO TABS
650.0000 mg | ORAL_TABLET | ORAL | Status: AC
  Administered 2015-05-08: 650 mg via ORAL

## 2015-05-08 NOTE — Patient Instructions (Signed)

## 2015-05-14 ENCOUNTER — Ambulatory Visit: Payer: Managed Care, Other (non HMO)

## 2015-05-15 ENCOUNTER — Ambulatory Visit (HOSPITAL_BASED_OUTPATIENT_CLINIC_OR_DEPARTMENT_OTHER): Payer: Managed Care, Other (non HMO)

## 2015-05-15 VITALS — BP 127/70 | HR 73 | Temp 98.1°F | Resp 16

## 2015-05-15 DIAGNOSIS — D500 Iron deficiency anemia secondary to blood loss (chronic): Secondary | ICD-10-CM | POA: Diagnosis not present

## 2015-05-15 DIAGNOSIS — N920 Excessive and frequent menstruation with regular cycle: Secondary | ICD-10-CM

## 2015-05-15 MED ORDER — ACETAMINOPHEN 325 MG PO TABS
ORAL_TABLET | ORAL | Status: AC
  Filled 2015-05-15: qty 2

## 2015-05-15 MED ORDER — DIPHENHYDRAMINE HCL 50 MG/ML IJ SOLN
INTRAMUSCULAR | Status: AC
  Filled 2015-05-15: qty 1

## 2015-05-15 MED ORDER — ACETAMINOPHEN 325 MG PO TABS
650.0000 mg | ORAL_TABLET | ORAL | Status: AC
  Administered 2015-05-15: 650 mg via ORAL

## 2015-05-15 MED ORDER — SODIUM CHLORIDE 0.9 % IV SOLN
510.0000 mg | Freq: Once | INTRAVENOUS | Status: AC
  Administered 2015-05-15: 510 mg via INTRAVENOUS
  Filled 2015-05-15: qty 17

## 2015-05-15 MED ORDER — SODIUM CHLORIDE 0.9 % IV SOLN
Freq: Once | INTRAVENOUS | Status: AC
  Administered 2015-05-15: 08:00:00 via INTRAVENOUS

## 2015-05-15 MED ORDER — DIPHENHYDRAMINE HCL 50 MG/ML IJ SOLN
25.0000 mg | INTRAMUSCULAR | Status: DC
  Administered 2015-05-15: 25 mg via INTRAVENOUS

## 2015-05-15 NOTE — Patient Instructions (Signed)

## 2015-06-25 ENCOUNTER — Ambulatory Visit: Payer: Managed Care, Other (non HMO) | Admitting: Hematology

## 2015-06-26 ENCOUNTER — Telehealth: Payer: Self-pay | Admitting: Hematology

## 2015-06-26 ENCOUNTER — Encounter: Payer: Self-pay | Admitting: Hematology

## 2015-06-26 ENCOUNTER — Other Ambulatory Visit (HOSPITAL_BASED_OUTPATIENT_CLINIC_OR_DEPARTMENT_OTHER): Payer: Managed Care, Other (non HMO)

## 2015-06-26 ENCOUNTER — Ambulatory Visit (HOSPITAL_BASED_OUTPATIENT_CLINIC_OR_DEPARTMENT_OTHER): Payer: Managed Care, Other (non HMO) | Admitting: Hematology

## 2015-06-26 VITALS — BP 123/59 | HR 81 | Temp 98.6°F | Resp 18 | Ht 68.0 in | Wt 297.4 lb

## 2015-06-26 DIAGNOSIS — D509 Iron deficiency anemia, unspecified: Secondary | ICD-10-CM

## 2015-06-26 DIAGNOSIS — D500 Iron deficiency anemia secondary to blood loss (chronic): Secondary | ICD-10-CM

## 2015-06-26 LAB — CBC & DIFF AND RETIC
BASO%: 0.5 % (ref 0.0–2.0)
Basophils Absolute: 0 10*3/uL (ref 0.0–0.1)
EOS%: 2.3 % (ref 0.0–7.0)
Eosinophils Absolute: 0.2 10*3/uL (ref 0.0–0.5)
HCT: 38.2 % (ref 34.8–46.6)
HEMOGLOBIN: 12.1 g/dL (ref 11.6–15.9)
IMMATURE RETIC FRACT: 4.2 % (ref 1.60–10.00)
LYMPH#: 3.3 10*3/uL (ref 0.9–3.3)
LYMPH%: 38.5 % (ref 14.0–49.7)
MCH: 27.1 pg (ref 25.1–34.0)
MCHC: 31.7 g/dL (ref 31.5–36.0)
MCV: 85.5 fL (ref 79.5–101.0)
MONO#: 0.5 10*3/uL (ref 0.1–0.9)
MONO%: 5.8 % (ref 0.0–14.0)
NEUT#: 4.6 10*3/uL (ref 1.5–6.5)
NEUT%: 52.9 % (ref 38.4–76.8)
Platelets: 249 10*3/uL (ref 145–400)
RBC: 4.47 10*6/uL (ref 3.70–5.45)
RDW: 22.6 % — AB (ref 11.2–14.5)
RETIC %: 1.08 % (ref 0.70–2.10)
RETIC CT ABS: 48.28 10*3/uL (ref 33.70–90.70)
WBC: 8.6 10*3/uL (ref 3.9–10.3)

## 2015-06-26 LAB — COMPREHENSIVE METABOLIC PANEL (CC13)
ALBUMIN: 3.5 g/dL (ref 3.5–5.0)
ALT: 19 U/L (ref 0–55)
AST: 20 U/L (ref 5–34)
Alkaline Phosphatase: 75 U/L (ref 40–150)
Anion Gap: 7 mEq/L (ref 3–11)
BUN: 12.4 mg/dL (ref 7.0–26.0)
CO2: 22 mEq/L (ref 22–29)
CREATININE: 0.7 mg/dL (ref 0.6–1.1)
Calcium: 8.8 mg/dL (ref 8.4–10.4)
Chloride: 110 mEq/L — ABNORMAL HIGH (ref 98–109)
EGFR: >90 mL/min/{1.73_m2} (ref >90)
Glucose: 97 mg/dl (ref 70–140)
POTASSIUM: 4 meq/L (ref 3.5–5.1)
SODIUM: 138 meq/L (ref 136–145)
Total Bilirubin: 0.41 mg/dL (ref 0.20–1.20)
Total Protein: 6.5 g/dL (ref 6.4–8.3)

## 2015-06-26 LAB — IRON AND TIBC CHCC
%SAT: 26 % (ref 21–57)
Iron: 78 ug/dL (ref 41–142)
TIBC: 303 ug/dL (ref 236–444)
UIBC: 224 ug/dL (ref 120–384)

## 2015-06-26 LAB — FERRITIN CHCC: FERRITIN: 43 ng/mL (ref 9–269)

## 2015-06-26 NOTE — Telephone Encounter (Signed)
per pof to sch pt appt-gave pt copy of avs °

## 2015-06-26 NOTE — Progress Notes (Signed)
.   Hematology oncology clinic note  Date of service: 06/26/2015  Patient Care Team: Osborne Oman, MD as PCP - General (Obstetrics and Gynecology) Brunetta Genera, MD as Consulting Physician (Hematology)  CHIEF COMPLAINTS/PURPOSE OF CONSULTATION:  Lightheadedness and fatigue/ " I am anemic"  HISTORY OF PRESENTING ILLNESS: Please see my previous clinic note for details of initial presentation  Interval history  [name] is here for follow-up of her anemia. She tolerated of 3 doses of IV Feraheme without any issues and notes that she is feeling much much better. Her hemoglobin has improved from 6.8 to 12. Her MCV has normalized. Her pica symptoms in the form of ice cravings have resolved. She has much more energy. She notes that her periods have become lighter and she did not eventually take transexamic acid 4 heavy periods. He continues to follow with GYN. Has not been taking any other over-the-counter oral iron at this time. No other acute new concerns.    MEDICAL HISTORY:  Past Medical History  Diagnosis Date  . Spasm of lung air passages   . Fibroids   . Obesity, Class III, BMI 40-49.9 (morbid obesity)     BMI 40  . Anemia   . Shortness of breath     "sometimes when I have a cold"  . Recurrent UTI (urinary tract infection)   . Patient Active Problem List   Diagnosis Date Noted  . Iron deficiency anemia due to chronic blood loss 04/24/2015  . Morbid obesity 04/24/2015  . Well woman exam with routine gynecological exam 07/15/2013  . Counseling for birth control, oral contraceptives 07/15/2013  . Fibroids 02/05/2012  . Menorrhagia 02/05/2012  . Symptomatic anemia due to menorrhagia 02/05/2012  . Red blood cell antibody positive 02/05/2012     SURGICAL HISTORY: Past Surgical History  Procedure Laterality Date  . Left arm surgey    . Therapeutic abortion    . Robot assisted myomectomy  07/12/2012    Procedure: ROBOTIC ASSISTED MYOMECTOMY;  Surgeon: Lavonia Drafts, MD;  Location: [facility];  Service: Gynecology;  Laterality: N/A;    SOCIAL HISTORY: Social History   Social History  . Marital Status: Single    Spouse Name: N/A  . Number of Children: N/A  . Years of Education: N/A   Occupational History  . Not on file.   Social History Main Topics  . Smoking status: Current Every Day Smoker -- 0.25 packs/day    Types: Cigarettes  . Smokeless tobacco: Never Used  . Alcohol Use: Yes     Comment: occasionally  . Drug Use: Yes    Special: Marijuana     Comment: patient states, "once in a while per month"  . Sexual Activity: Not Currently    Birth Control/ Protection: None, Condom   Other Topics Concern  . Not on file   Social History Narrative    FAMILY HISTORY: Family History  Problem Relation Age of Onset  . Fibroids Mother   . Hypertension Mother     ALLERGIES:  has No Known Allergies.  MEDICATIONS:  . Current outpatient prescriptions:  .  brompheniramine-pseudoephedrine-DM 30-2-10 MG/5ML syrup, TK 10 MLS PO Q 4 TO 6 H, Disp: , Rfl: 0 .  fluticasone (FLONASE) 50 MCG/ACT nasal spray, INT 1 TO 2 SPRAYS QD, Disp: , Rfl: 0 .  ibuprofen (ADVIL,MOTRIN) 600 MG tablet, Take 1 tablet (600 mg total) by mouth every 6 (six) hours as needed for pain., Disp: 30 tablet, Rfl: 0 .  norethindrone-ethinyl estradiol (MICROGESTIN,JUNEL,LOESTRIN) 1-20 MG-MCG tablet, Take 1 tablet by mouth daily., Disp: 1 Package, Rfl: 11 .  ondansetron (ZOFRAN-ODT) 4 MG disintegrating tablet, DISSOLVE 1 IN MOUTH  Q 4 TO 6 H PRN, Disp: , Rfl: 0 .  Specialty Vitamins Products (VITAMINS FOR HAIR PO), Take by mouth., Disp: , Rfl:  .  tranexamic acid (LYSTEDA) 650 MG TABS tablet, Take 1,300 mg by mouth 3 (three) times daily., Disp: , Rfl:    REVIEW OF SYSTEMS:   Constitutional: Denies fevers, chills or abnormal night sweats Eyes: Denies blurriness of vision, double vision or watery eyes Ears, nose, mouth, throat, and face: Denies mucositis or sore  throat Respiratory: Denies cough, dyspnea or wheezes Cardiovascular: Denies palpitation, chest discomfort or lower extremity swelling Gastrointestinal:  Denies nausea, heartburn or change in bowel habits Skin: Denies abnormal skin rashes Lymphatics: Denies new lymphadenopathy or easy bruising Neurological:Denies numbness, tingling or new weaknesses Behavioral/Psych: Mood is stable, no new changes  All other systems were reviewed with the patient and are negative.  PHYSICAL EXAMINATION: ECOG PERFORMANCE STATUS: 1 - Symptomatic but completely ambulatory  Filed Vitals:   06/26/15 0831  BP: 123/59  Pulse: 81  Temp: 98.6 F (37 C)  Resp: 18   Filed Weights   06/26/15 0831  Weight: 297 lb 6.4 oz (134.9 kg)    GENERAL:alert, no distress and comfortable pallor SKIN: skin color, texture, turgor are normal, no rashes or significant lesions EYES: normal, conjunctivae with pallor noted no scleral icterus  OROPHARYNX:no exudate, no erythema and lips, buccal mucosa, and tongue normal  NECK: supple, thyroid normal size, non-tender, without nodularity LYMPH:  no palpable lymphadenopathy in the cervical, axillary or inguinal LUNGS: clear to auscultation and percussion with normal breathing effort HEART: regular rate & rhythm and no murmurs and no lower extremity edema ABDOMEN:abdomen obese, soft, non-tender and normal bowel sounds. No palpable hepatosplenomegaly Musculoskeletal:no cyanosis of digits and no clubbing  PSYCH: alert & oriented x 3 with fluent speech NEURO: no focal motor/sensory deficits  LABORATORY DATA:  . CBC Latest Ref Rng 06/26/2015 04/24/2015 07/12/2012  WBC 3.9 - 10.3 10e3/uL 8.6 12.1(H) 9.1  Hemoglobin 11.6 - 15.9 g/dL 12.1 6.8(LL) 9.8(L)  Hematocrit 34.8 - 46.6 % 38.2 27.1(L) 32.7(L)  Platelets 145 - 400 10e3/uL 249 305 302    .chondromalacia patella  . Lab Results  Component Value Date   IRON 78 06/26/2015   TIBC 303 06/26/2015   IRONPCTSAT 26 06/26/2015    (Iron and TIBC)  Lab Results  Component Value Date   FERRITIN 43 06/26/2015    . Lab Results  Component Value Date   RETICCTPCT 1.08 06/26/2015   RBC 4.47 06/26/2015   RETICCTABS 48.28 06/26/2015    ASSESSMENT & PLAN:   1) Iron deficiency anemia due to chronic blood loss Patient has significant microcytic anemia with an MCV of 61 and hemoglobin of 6.8 today. This is predominantly due to iron deficiency with a ferritin of 6 and iron saturation 2%. LDH is normal which rules out any significant hemolysis this time.  Iron deficiency appears to be primarily due to heavy menstrual losses. No evidence of other overt blood loss. Patient notes that she has never had any previous issues with GI bleeding or ulcers and has not needed a EGD or colonoscopy.  Patient tolerated 3 doses of IV Feraheme without any issues. Her hemoglobin levels have normalized to 12.1 from 6.8. Her MCV is now normal with suggests that the patient has no other underlying thalassemia/hemoglobinopathy.  She notes that she feels much better. No issues with pica. Fatigue much improved. Ferritin improved to 43.  Plan -Patient will continue to follow with her GYN doctors to address the etiology of her menorrhagia. She does note that her menstrual losses have decreased and so she isn't taking her tranexamic acid. -Return to care with Dr. Irene Limbo in 3 months with CBC, CMP, iron labs to check for additional IV iron requirement. -Primary care physician to consider GI workup with EGD and colonoscopy if the patient continues to develop iron deficiency despite addressing her heavy menstrual losses.     Sullivan Lone, MD 06/26/2015 11:18 AM

## 2015-07-25 ENCOUNTER — Telehealth: Payer: Self-pay | Admitting: Hematology

## 2015-07-25 NOTE — Telephone Encounter (Signed)
LVM advising appt chg from 12/13 md pal to 12/6 @8 .15am. Also mailed revised appt calendar.

## 2015-09-07 ENCOUNTER — Telehealth: Payer: Self-pay | Admitting: Hematology

## 2015-09-07 NOTE — Telephone Encounter (Signed)
PAL - moved from 12/6 to 12/13. Spoke with patient she is aware.

## 2015-09-18 ENCOUNTER — Ambulatory Visit: Payer: Managed Care, Other (non HMO) | Admitting: Hematology

## 2015-09-18 ENCOUNTER — Other Ambulatory Visit: Payer: Managed Care, Other (non HMO)

## 2015-09-25 ENCOUNTER — Encounter: Payer: Self-pay | Admitting: Hematology

## 2015-09-25 ENCOUNTER — Ambulatory Visit: Payer: Managed Care, Other (non HMO) | Admitting: Hematology

## 2015-09-25 ENCOUNTER — Other Ambulatory Visit: Payer: Managed Care, Other (non HMO)

## 2015-09-25 ENCOUNTER — Other Ambulatory Visit (HOSPITAL_BASED_OUTPATIENT_CLINIC_OR_DEPARTMENT_OTHER): Payer: Managed Care, Other (non HMO)

## 2015-09-25 ENCOUNTER — Ambulatory Visit (HOSPITAL_BASED_OUTPATIENT_CLINIC_OR_DEPARTMENT_OTHER): Payer: Managed Care, Other (non HMO) | Admitting: Hematology

## 2015-09-25 ENCOUNTER — Telehealth: Payer: Self-pay | Admitting: Hematology

## 2015-09-25 VITALS — BP 126/67 | HR 88 | Temp 98.6°F | Resp 18 | Ht 68.0 in | Wt 303.4 lb

## 2015-09-25 DIAGNOSIS — D500 Iron deficiency anemia secondary to blood loss (chronic): Secondary | ICD-10-CM

## 2015-09-25 LAB — CBC & DIFF AND RETIC
BASO%: 0.6 % (ref 0.0–2.0)
Basophils Absolute: 0.1 10*3/uL (ref 0.0–0.1)
EOS%: 2.7 % (ref 0.0–7.0)
Eosinophils Absolute: 0.3 10*3/uL (ref 0.0–0.5)
HCT: 39 % (ref 34.8–46.6)
HGB: 11.7 g/dL (ref 11.6–15.9)
Immature Retic Fract: 17.9 % — ABNORMAL HIGH (ref 1.60–10.00)
LYMPH%: 45.3 % (ref 14.0–49.7)
MCH: 27.8 pg (ref 25.1–34.0)
MCHC: 30 g/dL — ABNORMAL LOW (ref 31.5–36.0)
MCV: 92.6 fL (ref 79.5–101.0)
MONO#: 0.6 10*3/uL (ref 0.1–0.9)
MONO%: 6 % (ref 0.0–14.0)
NEUT#: 4.7 10*3/uL (ref 1.5–6.5)
NEUT%: 45.4 % (ref 38.4–76.8)
Platelets: 298 10*3/uL (ref 145–400)
RBC: 4.21 10*6/uL (ref 3.70–5.45)
RDW: 12.5 % (ref 11.2–14.5)
Retic %: 1.72 % (ref 0.70–2.10)
Retic Ct Abs: 72.41 10*3/uL (ref 33.70–90.70)
WBC: 10.4 10*3/uL — ABNORMAL HIGH (ref 3.9–10.3)
lymph#: 4.7 10*3/uL — ABNORMAL HIGH (ref 0.9–3.3)

## 2015-09-25 LAB — FERRITIN: Ferritin: 17 ng/ml (ref 9–269)

## 2015-09-25 LAB — IRON AND TIBC
%SAT: 8 % — AB (ref 21–57)
Iron: 30 ug/dL — ABNORMAL LOW (ref 41–142)
TIBC: 371 ug/dL (ref 236–444)
UIBC: 341 ug/dL (ref 120–384)

## 2015-09-25 NOTE — Telephone Encounter (Signed)
Gave and printd appt sched and avs for pt for June 2017

## 2015-10-02 NOTE — Progress Notes (Signed)
.   Hematology oncology clinic note  Date of service: .09/25/2015   Patient Care Team: Yvonne Oman, MD as PCP - General (Obstetrics and Gynecology) Yvonne Genera, MD as Consulting Physician (Hematology)  CHIEF COMPLAINTS/PURPOSE OF CONSULTATION:  Lightheadedness and fatigue/ " I am anemic"  HISTORY OF PRESENTING ILLNESS: Please see my previous clinic note for details of initial presentation  Interval history  [name] is here for follow-up of her anemia. She notes that she is feeling well overall. No significant fatigue. No pica symptoms. Notes that her menstrual flow has reduced since she started taking her Lysteda.  No other acute new concerns.    MEDICAL HISTORY:  Past Medical History  Diagnosis Date  . Spasm of lung air passages   . Fibroids   . Obesity, Class III, BMI 40-49.9 (morbid obesity) (HCC)     BMI 40  . Anemia   . Shortness of breath     "sometimes when I have a cold"  . Recurrent UTI (urinary tract infection)   . Patient Active Problem List   Diagnosis Date Noted  . Iron deficiency anemia due to chronic blood loss 04/24/2015  . Morbid obesity ([place]) 04/24/2015  . Well woman exam with routine gynecological exam 07/15/2013  . Counseling for birth control, oral contraceptives 07/15/2013  . Fibroids 02/05/2012  . Menorrhagia 02/05/2012  . Symptomatic anemia due to menorrhagia 02/05/2012  . Red blood cell antibody positive 02/05/2012     SURGICAL HISTORY: Past Surgical History  Procedure Laterality Date  . Left arm surgey    . Therapeutic abortion    . Robot assisted myomectomy  07/12/2012    Procedure: ROBOTIC ASSISTED MYOMECTOMY;  Surgeon: Yvonne Drafts, MD;  Location: [facility];  Service: Gynecology;  Laterality: N/A;    SOCIAL HISTORY: Social History   Social History  . Marital Status: Single    Spouse Name: N/A  . Number of Children: N/A  . Years of Education: N/A   Occupational History  . Not on file.   Social  History Main Topics  . Smoking status: Current Every Day Smoker -- 0.25 packs/day    Types: Cigarettes  . Smokeless tobacco: Never Used  . Alcohol Use: Yes     Comment: occasionally  . Drug Use: Yes    Special: Marijuana     Comment: patient states, "once in a while per month"  . Sexual Activity: Not Currently    Birth Control/ Protection: None, Condom   Other Topics Concern  . Not on file   Social History Narrative    FAMILY HISTORY: Family History  Problem Relation Age of Onset  . Fibroids Mother   . Hypertension Mother     ALLERGIES:  has No Known Allergies.  MEDICATIONS:  . Current outpatient prescriptions:  .  brompheniramine-pseudoephedrine-DM 30-2-10 MG/5ML syrup, TK 10 MLS PO Q 4 TO 6 H, Disp: , Rfl: 0 .  fluticasone (FLONASE) 50 MCG/ACT nasal spray, INT 1 TO 2 SPRAYS QD, Disp: , Rfl: 0 .  ibuprofen (ADVIL,MOTRIN) 600 MG tablet, Take 1 tablet (600 mg total) by mouth every 6 (six) hours as needed for pain., Disp: 30 tablet, Rfl: 0 .  norethindrone-ethinyl estradiol (MICROGESTIN,JUNEL,LOESTRIN) 1-20 MG-MCG tablet, Take 1 tablet by mouth daily., Disp: 1 Package, Rfl: 11 .  ondansetron (ZOFRAN-ODT) 4 MG disintegrating tablet, DISSOLVE 1 IN MOUTH  Q 4 TO 6 H PRN, Disp: , Rfl: 0 .  Specialty Vitamins Products (VITAMINS FOR HAIR PO), Take by mouth., Disp: ,  Rfl:  .  tranexamic acid (LYSTEDA) 650 MG TABS tablet, Take 1,300 mg by mouth 3 (three) times daily., Disp: , Rfl:  .  ibuprofen (ADVIL,MOTRIN) 800 MG tablet, , Disp: , Rfl:    REVIEW OF SYSTEMS:   Constitutional: Denies fevers, chills or abnormal night sweats Eyes: Denies blurriness of vision, double vision or watery eyes Ears, nose, mouth, throat, and face: Denies mucositis or sore throat Respiratory: Denies cough, dyspnea or wheezes Cardiovascular: Denies palpitation, chest discomfort or lower extremity swelling Gastrointestinal:  Denies nausea, heartburn or change in bowel habits Skin: Denies abnormal skin  rashes Lymphatics: Denies new lymphadenopathy or easy bruising Neurological:Denies numbness, tingling or new weaknesses Behavioral/Psych: Mood is stable, no new changes  All other systems were reviewed with the patient and are negative.  PHYSICAL EXAMINATION: ECOG PERFORMANCE STATUS: 1 - Symptomatic but completely ambulatory  Filed Vitals:   09/25/15 0832  BP: 126/67  Pulse: 88  Temp: 98.6 F (37 C)  Resp: 18   Filed Weights   09/25/15 0832  Weight: 303 lb 6.4 oz (137.621 kg)    GENERAL:alert, no distress and comfortable pallor SKIN: skin color, texture, turgor are normal, no rashes or significant lesions EYES: normal, conjunctivae with pallor noted no scleral icterus  OROPHARYNX:no exudate, no erythema and lips, buccal mucosa, and tongue normal  NECK: supple, thyroid normal size, non-tender, without nodularity LYMPH:  no palpable lymphadenopathy in the cervical, axillary or inguinal LUNGS: clear to auscultation and percussion with normal breathing effort HEART: regular rate & rhythm and no murmurs and no lower extremity edema ABDOMEN:abdomen obese, soft, non-tender and normal bowel sounds. No palpable hepatosplenomegaly Musculoskeletal:no cyanosis of digits and no clubbing  PSYCH: alert & oriented x 3 with fluent speech NEURO: no focal motor/sensory deficits  LABORATORY DATA:  . CBC Latest Ref Rng 09/25/2015 06/26/2015 04/24/2015  WBC 3.9 - 10.3 10e3/uL 10.4(H) 8.6 12.1(H)  Hemoglobin 11.6 - 15.9 g/dL 11.7 12.1 6.8(LL)  Hematocrit 34.8 - 46.6 % 39.0 38.2 27.1(L)  Platelets 145 - 400 10e3/uL 298 249 305    . CBC    Component Value Date/Time   WBC 10.4* 09/25/2015 0819   WBC 9.1 07/12/2012 0625   RBC 4.21 09/25/2015 0819   RBC 3.73* 07/12/2012 0625   HGB 11.7 09/25/2015 0819   HGB 9.8* 07/12/2012 0625   HCT 39.0 09/25/2015 0819   HCT 32.7* 07/12/2012 0625   PLT 298 09/25/2015 0819   PLT 302 07/12/2012 0625   MCV 92.6 09/25/2015 0819   MCV 87.7 07/12/2012 0625     MCH 27.8 09/25/2015 0819   MCH 26.3 07/12/2012 0625   MCHC 30.0* 09/25/2015 0819   MCHC 30.0 07/12/2012 0625   RDW 12.5 09/25/2015 0819   RDW 14.5 07/12/2012 0625   LYMPHSABS 4.7* 09/25/2015 0819   LYMPHSABS 1.8 05/11/2010 1848   MONOABS 0.6 09/25/2015 0819   MONOABS 0.9 05/11/2010 1848   EOSABS 0.3 09/25/2015 0819   EOSABS 0.1 05/11/2010 1848   BASOSABS 0.1 09/25/2015 0819   BASOSABS 0.0 05/11/2010 1848      . Lab Results  Component Value Date   IRON 30* 09/25/2015   TIBC 371 09/25/2015   IRONPCTSAT 8* 09/25/2015   (Iron and TIBC)  Lab Results  Component Value Date   FERRITIN 17 09/25/2015    . Lab Results  Component Value Date   RETICCTPCT 1.72 09/25/2015   RBC 4.21 09/25/2015   RETICCTABS 72.41 09/25/2015    ASSESSMENT & PLAN:   1) Iron deficiency  anemia due to chronic blood loss - hemoglobin today stable at 11.7 MCV is within normal limits. Iron deficiency appears to be primarily due to heavy menstrual losses. No evidence of other overt blood loss. Patient notes that she has never had any previous issues with GI bleeding or ulcers and has not needed a EGD or colonoscopy.  Patient is s/p and tolerated 3 doses of IV Feraheme without any issues. Her hemoglobin levels have normalized to 12.1 from 6.8. Her MCV is now normal with suggests that the patient has no other underlying thalassemia/hemoglobinopathy.  Patient energy levels are quite reasonable at this time. Note no significant anemia. Ferritin had improved to 43 but is drifting back down at 17.  She notes that her menstrual losses have tapered off since she has been taking the Lysteda. Plan -patient might eventually again need IV iron if her menstrual losses are not controlled. She notes that her menorrhagia is improved on Lysteda. -counseled regarding balanced diet with adequate oral iron intake. -We'll reassess iron levels and hemogram in 6 months to determine need for further IV iron. -Patient will  continue to follow with her GYN doctors to address the etiology of her menorrhagia. She does note that her menstrual losses have decreased with Lysteda  -Return to care with Dr. Irene Limbo in 6 months with CBC, CMP, iron labs to check for additional IV iron requirement.  -Primary care physician to consider GI workup with EGD and colonoscopy if the patient continues to develop iron deficiency despite addressing her heavy menstrual losses.  Sullivan Lone MD [place] [facility]IVMS [facility] Hematology/Oncology Physician [place]  (Office):       [phone] (Work cell):  [phone] (Fax):           [phone]

## 2016-03-25 ENCOUNTER — Other Ambulatory Visit: Payer: Managed Care, Other (non HMO)

## 2016-03-25 ENCOUNTER — Ambulatory Visit: Payer: Managed Care, Other (non HMO) | Admitting: Hematology

## 2016-03-26 ENCOUNTER — Telehealth: Payer: Self-pay | Admitting: Hematology

## 2016-03-26 NOTE — Telephone Encounter (Signed)
pt called to resched missed apt .. confirmed apt for 6/15

## 2016-03-27 ENCOUNTER — Other Ambulatory Visit (HOSPITAL_BASED_OUTPATIENT_CLINIC_OR_DEPARTMENT_OTHER): Payer: Managed Care, Other (non HMO)

## 2016-03-27 ENCOUNTER — Ambulatory Visit (HOSPITAL_BASED_OUTPATIENT_CLINIC_OR_DEPARTMENT_OTHER): Payer: Managed Care, Other (non HMO) | Admitting: Hematology

## 2016-03-27 ENCOUNTER — Telehealth: Payer: Self-pay | Admitting: Hematology

## 2016-03-27 ENCOUNTER — Encounter: Payer: Self-pay | Admitting: Hematology

## 2016-03-27 VITALS — BP 137/65 | HR 91 | Temp 98.8°F | Resp 18 | Ht 68.0 in | Wt 320.5 lb

## 2016-03-27 DIAGNOSIS — D500 Iron deficiency anemia secondary to blood loss (chronic): Secondary | ICD-10-CM

## 2016-03-27 LAB — CBC & DIFF AND RETIC
BASO%: 0.5 % (ref 0.0–2.0)
BASOS ABS: 0 10*3/uL (ref 0.0–0.1)
EOS ABS: 0.2 10*3/uL (ref 0.0–0.5)
EOS%: 2.5 % (ref 0.0–7.0)
HEMATOCRIT: 32.3 % — AB (ref 34.8–46.6)
HEMOGLOBIN: 9.7 g/dL — AB (ref 11.6–15.9)
IMMATURE RETIC FRACT: 18.8 % — AB (ref 1.60–10.00)
LYMPH#: 3.5 10*3/uL — AB (ref 0.9–3.3)
LYMPH%: 42.8 % (ref 14.0–49.7)
MCH: 23.5 pg — ABNORMAL LOW (ref 25.1–34.0)
MCHC: 30 g/dL — ABNORMAL LOW (ref 31.5–36.0)
MCV: 78.4 fL — ABNORMAL LOW (ref 79.5–101.0)
MONO#: 0.5 10*3/uL (ref 0.1–0.9)
MONO%: 6.6 % (ref 0.0–14.0)
NEUT%: 47.6 % (ref 38.4–76.8)
NEUTROS ABS: 3.9 10*3/uL (ref 1.5–6.5)
Platelets: 281 10*3/uL (ref 145–400)
RBC: 4.12 10*6/uL (ref 3.70–5.45)
RDW: 14.8 % — AB (ref 11.2–14.5)
RETIC %: 1.81 % (ref 0.70–2.10)
RETIC CT ABS: 74.57 10*3/uL (ref 33.70–90.70)
WBC: 8.2 10*3/uL (ref 3.9–10.3)

## 2016-03-27 LAB — COMPREHENSIVE METABOLIC PANEL
ALBUMIN: 3.5 g/dL (ref 3.5–5.0)
ALK PHOS: 65 U/L (ref 40–150)
ALT: 15 U/L (ref 0–55)
AST: 16 U/L (ref 5–34)
Anion Gap: 8 mEq/L (ref 3–11)
BUN: 10.5 mg/dL (ref 7.0–26.0)
CALCIUM: 8.6 mg/dL (ref 8.4–10.4)
CO2: 22 mEq/L (ref 22–29)
CREATININE: 0.8 mg/dL (ref 0.6–1.1)
Chloride: 108 mEq/L (ref 98–109)
EGFR: >90 mL/min/{1.73_m2} (ref >90)
GLUCOSE: 93 mg/dL (ref 70–140)
Potassium: 3.8 mEq/L (ref 3.5–5.1)
SODIUM: 139 meq/L (ref 136–145)
TOTAL PROTEIN: 6.8 g/dL (ref 6.4–8.3)
Total Bilirubin: 0.55 mg/dL (ref 0.20–1.20)

## 2016-03-27 LAB — IRON AND TIBC
%SAT: 6 % — ABNORMAL LOW (ref 21–57)
Iron: 21 ug/dL — ABNORMAL LOW (ref 41–142)
TIBC: 370 ug/dL (ref 236–444)
UIBC: 349 ug/dL (ref 120–384)

## 2016-03-27 LAB — FERRITIN: Ferritin: 16 ng/ml (ref 9–269)

## 2016-03-27 MED ORDER — POLYSACCHARIDE IRON COMPLEX 150 MG PO CAPS: 150.0000 mg | ORAL_CAPSULE | Freq: Every day | ORAL | Status: DC

## 2016-03-27 NOTE — Telephone Encounter (Signed)
Gave and printed appt sched and avs for pt for June and Aug °

## 2016-03-27 NOTE — Progress Notes (Signed)
.   Hematology oncology clinic note  Date of service: 03/27/2016  Patient Care Team: Osborne Oman, MD as PCP - General (Obstetrics and Gynecology) Brunetta Genera, MD as Consulting Physician (Hematology)  CHIEF COMPLAINTS/PURPOSE OF CONSULTATION:  Lightheadedness and fatigue/ " I am anemic"  HISTORY OF PRESENTING ILLNESS: Please see my previous clinic note for details of initial presentation  Interval history  [name] is here for follow-up of her anemia. She notes that the Lysteda was reducing her menstrual flows but that it was costing $85 a month and that she could not afford this. She recently stopped this prior to her last menstrual cycle and this was heavier. She has started to crave ice again and has some fatigue. Hemoglobin down to 9.7 with developing microcytosis. She is willing to try some oral iron for maintenance purposes. She also has follow-up with her gynecologist to work on other options to control her menorrhagia. She is keen to replace her iron IV so that she doesn't get more fatigued and anemic since this affects her studies and work. She is taking a full course of the summer in getting her MBA. Regular bowel habits. No blood in her stools or black stools.    MEDICAL HISTORY:  Past Medical History  Diagnosis Date  . Spasm of lung air passages   . Fibroids   . Obesity, Class III, BMI 40-49.9 (morbid obesity) (HCC)     BMI 40  . Anemia   . Shortness of breath     "sometimes when I have a cold"  . Recurrent UTI (urinary tract infection)   . Patient Active Problem List   Diagnosis Date Noted  . Iron deficiency anemia due to chronic blood loss 04/24/2015  . Morbid obesity ([place]) 04/24/2015  . Well woman exam with routine gynecological exam 07/15/2013  . Counseling for birth control, oral contraceptives 07/15/2013  . Fibroids 02/05/2012  . Menorrhagia 02/05/2012  . Symptomatic anemia due to menorrhagia 02/05/2012  . Red blood cell antibody positive  02/05/2012     SURGICAL HISTORY: Past Surgical History  Procedure Laterality Date  . Left arm surgey    . Therapeutic abortion    . Robot assisted myomectomy  07/12/2012    Procedure: ROBOTIC ASSISTED MYOMECTOMY;  Surgeon: Lavonia Drafts, MD;  Location: [facility];  Service: Gynecology;  Laterality: N/A;    SOCIAL HISTORY: Social History   Social History  . Marital Status: Single    Spouse Name: N/A  . Number of Children: N/A  . Years of Education: N/A   Occupational History  . Not on file.   Social History Main Topics  . Smoking status: Current Every Day Smoker -- 0.25 packs/day    Types: Cigarettes  . Smokeless tobacco: Never Used  . Alcohol Use: Yes     Comment: occasionally  . Drug Use: Yes    Special: Marijuana     Comment: patient states, "once in a while per month"  . Sexual Activity: Not Currently    Birth Control/ Protection: None, Condom   Other Topics Concern  . Not on file   Social History Narrative    FAMILY HISTORY: Family History  Problem Relation Age of Onset  . Fibroids Mother   . Hypertension Mother     ALLERGIES:  has No Known Allergies.  MEDICATIONS:  . Current outpatient prescriptions:  .  brompheniramine-pseudoephedrine-DM 30-2-10 MG/5ML syrup, TK 10 MLS PO Q 4 TO 6 H, Disp: , Rfl: 0 .  fluticasone (FLONASE)  50 MCG/ACT nasal spray, INT 1 TO 2 SPRAYS QD, Disp: , Rfl: 0 .  ibuprofen (ADVIL,MOTRIN) 600 MG tablet, Take 1 tablet (600 mg total) by mouth every 6 (six) hours as needed for pain., Disp: 30 tablet, Rfl: 0 .  ibuprofen (ADVIL,MOTRIN) 800 MG tablet, , Disp: , Rfl:  .  iron polysaccharides (NIFEREX) 150 MG capsule, Take 1 capsule (150 mg total) by mouth daily., Disp: 30 capsule, Rfl: 6 .  norethindrone-ethinyl estradiol (MICROGESTIN,JUNEL,LOESTRIN) 1-20 MG-MCG tablet, Take 1 tablet by mouth daily., Disp: 1 Package, Rfl: 11 .  ondansetron (ZOFRAN-ODT) 4 MG disintegrating tablet, DISSOLVE 1 IN MOUTH  Q 4 TO 6 H PRN, Disp: , Rfl:  0 .  Specialty Vitamins Products (VITAMINS FOR HAIR PO), Take by mouth., Disp: , Rfl:    REVIEW OF SYSTEMS:   Constitutional: Denies fevers, chills or abnormal night sweats Eyes: Denies blurriness of vision, double vision or watery eyes Ears, nose, mouth, throat, and face: Denies mucositis or sore throat Respiratory: Denies cough, dyspnea or wheezes Cardiovascular: Denies palpitation, chest discomfort or lower extremity swelling Gastrointestinal:  Denies nausea, heartburn or change in bowel habits Skin: Denies abnormal skin rashes Lymphatics: Denies new lymphadenopathy or easy bruising Neurological:Denies numbness, tingling or new weaknesses Behavioral/Psych: Mood is stable, no new changes  All other systems were reviewed with the patient and are negative.  PHYSICAL EXAMINATION: ECOG PERFORMANCE STATUS: 1 - Symptomatic but completely ambulatory  Filed Vitals:   03/27/16 0833  BP: 137/65  Pulse: 91  Temp: 98.8 F (37.1 C)  Resp: 18   Filed Weights   03/27/16 0833  Weight: 320 lb 8 oz (145.378 kg)    GENERAL:alert, no distress and comfortable pallor SKIN: skin color, texture, turgor are normal, no rashes or significant lesions EYES: normal, conjunctivae with pallor noted no scleral icterus  OROPHARYNX:no exudate, no erythema and lips, buccal mucosa, and tongue normal  NECK: supple, thyroid normal size, non-tender, without nodularity LYMPH:  no palpable lymphadenopathy in the cervical, axillary or inguinal LUNGS: clear to auscultation and percussion with normal breathing effort HEART: regular rate & rhythm and no murmurs and no lower extremity edema ABDOMEN:abdomen obese, soft, non-tender and normal bowel sounds. No palpable hepatosplenomegaly Musculoskeletal:no cyanosis of digits and no clubbing  PSYCH: alert & oriented x 3 with fluent speech NEURO: no focal motor/sensory deficits  LABORATORY DATA:  . CBC Latest Ref Rng 03/27/2016 09/25/2015 06/26/2015  WBC 3.9 - 10.3  10e3/uL 8.2 10.4(H) 8.6  Hemoglobin 11.6 - 15.9 g/dL 9.7(L) 11.7 12.1  Hematocrit 34.8 - 46.6 % 32.3(L) 39.0 38.2  Platelets 145 - 400 10e3/uL 281 298 249    CBC    Component Value Date/Time   WBC 8.2 03/27/2016 0816   WBC 9.1 07/12/2012 0625   RBC 4.12 03/27/2016 0816   RBC 3.73* 07/12/2012 0625   HGB 9.7* 03/27/2016 0816   HGB 9.8* 07/12/2012 0625   HCT 32.3* 03/27/2016 0816   HCT 32.7* 07/12/2012 0625   PLT 281 03/27/2016 0816   PLT 302 07/12/2012 0625   MCV 78.4* 03/27/2016 0816   MCV 87.7 07/12/2012 0625   MCH 23.5* 03/27/2016 0816   MCH 26.3 07/12/2012 0625   MCHC 30.0* 03/27/2016 0816   MCHC 30.0 07/12/2012 0625   RDW 14.8* 03/27/2016 0816   RDW 14.5 07/12/2012 0625   LYMPHSABS 3.5* 03/27/2016 0816   LYMPHSABS 1.8 05/11/2010 1848   MONOABS 0.5 03/27/2016 0816   MONOABS 0.9 05/11/2010 1848   EOSABS 0.2 03/27/2016 0816  EOSABS 0.1 05/11/2010 1848   BASOSABS 0.0 03/27/2016 0816   BASOSABS 0.0 05/11/2010 1848     ASSESSMENT & PLAN:   1) Iron deficiency anemia due to chronic blood loss - hemoglobin today stable at 11.7 MCV is within normal limits. Iron deficiency appears to be primarily due to heavy menstrual losses. No evidence of other overt blood loss. Patient notes that she has never had any previous issues with GI bleeding or ulcers and has not needed a EGD or colonoscopy.  Patient is s/p and tolerated 3 doses of IV Feraheme without any issues. Her hemoglobin levels have normalized to 12.1 from 6.8. Her MCV is now normal with suggests that the patient has no other underlying thalassemia/hemoglobinopathy.  Patient energy levels are quite reasonable at this time. Note no significant anemia. Ferritin had improved to 43 but is drifting back down at 17.  She notes that her menstrual losses had tapered off since she has been taking the Lysteda. She is off Lysteda due to cost concerns   Patient appears to becoming iron deficient again with a drop in hemoglobin to  9.7 and development of microcytic indices and  Increasing fatigue and reappearance of ice craving. Plan -Given progressive anemia and fatigue and ongoing heavy menstrual losses we shall replaced her iron aggressively to maintain a ferritin closer to 100. -Ordered for 2 more doses of IV Feraheme 510 mg every weekly 2 doses with Tylenol and Benadryl premedications. -We shall also start her on iron polysaccharide 150 mg by mouth daily with orange juice. -Patient will continue to follow with her GYN doctors to address the etiology of her menorrhagia.   -Return to care with Dr. Irene Limbo in 2 months with CBC, CMP, iron labs to check for additional IV iron requirement.  Sullivan Lone MD [place] [facility] Hematology/Oncology Physician [employer]  (Office):       [phone] (Work cell):  [phone] (Fax):           [phone]

## 2016-03-28 ENCOUNTER — Ambulatory Visit (HOSPITAL_BASED_OUTPATIENT_CLINIC_OR_DEPARTMENT_OTHER): Payer: Managed Care, Other (non HMO)

## 2016-03-28 VITALS — BP 105/60 | HR 60 | Temp 98.4°F | Resp 18

## 2016-03-28 DIAGNOSIS — D500 Iron deficiency anemia secondary to blood loss (chronic): Secondary | ICD-10-CM | POA: Diagnosis not present

## 2016-03-28 MED ORDER — ACETAMINOPHEN 325 MG PO TABS
650.0000 mg | ORAL_TABLET | ORAL | Status: AC
  Administered 2016-03-28: 650 mg via ORAL

## 2016-03-28 MED ORDER — DIPHENHYDRAMINE HCL 50 MG/ML IJ SOLN
25.0000 mg | INTRAMUSCULAR | Status: DC
  Administered 2016-03-28: 25 mg via INTRAVENOUS

## 2016-03-28 MED ORDER — DIPHENHYDRAMINE HCL 25 MG PO CAPS
ORAL_CAPSULE | ORAL | Status: AC
  Filled 2016-03-28: qty 1

## 2016-03-28 MED ORDER — DIPHENHYDRAMINE HCL 50 MG/ML IJ SOLN
INTRAMUSCULAR | Status: AC
Start: 2016-03-28 — End: 2016-03-28
  Filled 2016-03-28: qty 1

## 2016-03-28 MED ORDER — SODIUM CHLORIDE 0.9 % IV SOLN
510.0000 mg | Freq: Once | INTRAVENOUS | Status: AC
  Administered 2016-03-28: 510 mg via INTRAVENOUS
  Filled 2016-03-28: qty 17

## 2016-03-28 MED ORDER — ACETAMINOPHEN 325 MG PO TABS
ORAL_TABLET | ORAL | Status: AC
  Filled 2016-03-28: qty 2

## 2016-03-28 MED ORDER — SODIUM CHLORIDE 0.9 % IV SOLN
Freq: Once | INTRAVENOUS | Status: AC
  Administered 2016-03-28: 08:00:00 via INTRAVENOUS

## 2016-03-28 NOTE — Patient Instructions (Signed)

## 2016-04-04 ENCOUNTER — Ambulatory Visit (HOSPITAL_BASED_OUTPATIENT_CLINIC_OR_DEPARTMENT_OTHER): Payer: Managed Care, Other (non HMO)

## 2016-04-04 VITALS — BP 125/57 | HR 87 | Temp 98.7°F | Resp 17

## 2016-04-04 DIAGNOSIS — D500 Iron deficiency anemia secondary to blood loss (chronic): Secondary | ICD-10-CM | POA: Diagnosis not present

## 2016-04-04 MED ORDER — DIPHENHYDRAMINE HCL 50 MG/ML IJ SOLN
INTRAMUSCULAR | Status: AC
  Filled 2016-04-04: qty 1

## 2016-04-04 MED ORDER — ACETAMINOPHEN 325 MG PO TABS
ORAL_TABLET | ORAL | Status: AC
  Filled 2016-04-04: qty 2

## 2016-04-04 MED ORDER — ACETAMINOPHEN 325 MG PO TABS
650.0000 mg | ORAL_TABLET | ORAL | Status: AC
  Administered 2016-04-04: 650 mg via ORAL

## 2016-04-04 MED ORDER — FERUMOXYTOL INJECTION 510 MG/17 ML
510.0000 mg | Freq: Once | INTRAVENOUS | Status: AC
  Administered 2016-04-04: 510 mg via INTRAVENOUS
  Filled 2016-04-04: qty 17

## 2016-04-04 MED ORDER — DIPHENHYDRAMINE HCL 50 MG/ML IJ SOLN
25.0000 mg | INTRAMUSCULAR | Status: DC
  Administered 2016-04-04: 25 mg via INTRAVENOUS

## 2016-04-04 NOTE — Patient Instructions (Signed)
Ferumoxytol injection What is this medicine? FERUMOXYTOL is an iron complex. Iron is used to make healthy red blood cells, which carry oxygen and nutrients throughout the body. This medicine is used to treat iron deficiency anemia in people with chronic kidney disease. This medicine may be used for other purposes; ask your health care provider or pharmacist if you have questions. What should I tell my health care provider before I take this medicine? They need to know if you have any of these conditions: -anemia not caused by low iron levels -high levels of iron in the blood -magnetic resonance imaging (MRI) test scheduled -an unusual or allergic reaction to iron, other medicines, foods, dyes, or preservatives -pregnant or trying to get pregnant -breast-feeding How should I use this medicine? This medicine is for injection into a vein. It is given by a health care professional in a hospital or clinic setting. Talk to your pediatrician regarding the use of this medicine in children. Special care may be needed. Overdosage: If you think you have taken too much of this medicine contact a poison control center or emergency room at once. NOTE: This medicine is only for you. Do not share this medicine with others. What if I miss a dose? It is important not to miss your dose. Call your doctor or health care professional if you are unable to keep an appointment. What may interact with this medicine? This medicine may interact with the following medications: -other iron products This list may not describe all possible interactions. Give your health care provider a list of all the medicines, herbs, non-prescription drugs, or dietary supplements you use. Also tell them if you smoke, drink alcohol, or use illegal drugs. Some items may interact with your medicine. What should I watch for while using this medicine? Visit your doctor or healthcare professional regularly. Tell your doctor or healthcare  professional if your symptoms do not start to get better or if they get worse. You may need blood work done while you are taking this medicine. You may need to follow a special diet. Talk to your doctor. Foods that contain iron include: whole grains/cereals, dried fruits, beans, or peas, leafy green vegetables, and organ meats (liver, kidney). What side effects may I notice from receiving this medicine? Side effects that you should report to your doctor or health care professional as soon as possible: -allergic reactions like skin rash, itching or hives, swelling of the face, lips, or tongue -breathing problems -changes in blood pressure -feeling faint or lightheaded, falls -fever or chills -flushing, sweating, or hot feelings -swelling of the ankles or feet Side effects that usually do not require medical attention (Report these to your doctor or health care professional if they continue or are bothersome.): -diarrhea -headache -nausea, vomiting -stomach pain This list may not describe all possible side effects. Call your doctor for medical advice about side effects. You may report side effects to FDA at 1-800-FDA-1088. Where should I keep my medicine? This drug is given in a hospital or clinic and will not be stored at home. NOTE: This sheet is a summary. It may not cover all possible information. If you have questions about this medicine, talk to your doctor, pharmacist, or health care provider.    2016, Elsevier/Gold Standard. (2012-05-14 15:23:36)    Iron Deficiency Anemia, Adult Anemia is a condition in which there are less red blood cells or hemoglobin in the blood than normal. Hemoglobin is the part of red blood cells that carries oxygen.   Iron deficiency anemia is anemia caused by too little iron. It is the most common type of anemia. It may leave you tired and short of breath. CAUSES   Lack of iron in the diet.  Poor absorption of iron, as seen with intestinal  disorders.  Intestinal bleeding.  Heavy periods. SIGNS AND SYMPTOMS  Mild anemia may not be noticeable. Symptoms may include:  Fatigue.  Headache.  Pale skin.  Weakness.  Tiredness.  Shortness of breath.  Dizziness.  Cold hands and feet.  Fast or irregular heartbeat. DIAGNOSIS  Diagnosis requires a thorough evaluation and physical exam by your health care provider. Blood tests are generally used to confirm iron deficiency anemia. Additional tests may be done to find the underlying cause of your anemia. These may include:  Testing for blood in the stool (fecal occult blood test).  A procedure to see inside the colon and rectum (colonoscopy).  A procedure to see inside the esophagus and stomach (endoscopy). TREATMENT  Iron deficiency anemia is treated by correcting the cause of the deficiency. Treatment may involve:  Adding iron-rich foods to your diet.  Taking iron supplements. Pregnant or breastfeeding women need to take extra iron because their normal diet usually does not provide the required amount.  Taking vitamins. Vitamin C improves the absorption of iron. Your health care provider may recommend that you take your iron tablets with a glass of orange juice or vitamin C supplement.  Medicines to make heavy menstrual flow lighter.  Surgery. HOME CARE INSTRUCTIONS   Take iron as directed by your health care provider.  If you cannot tolerate taking iron supplements by mouth, talk to your health care provider about taking them through a vein (intravenously) or an injection into a muscle.  For the best iron absorption, iron supplements should be taken on an empty stomach. If you cannot tolerate them on an empty stomach, you may need to take them with food.  Do not drink milk or take antacids at the same time as your iron supplements. Milk and antacids may interfere with the absorption of iron.  Iron supplements can cause constipation. Make sure to include fiber  in your diet to prevent constipation. A stool softener may also be recommended.  Take vitamins as directed by your health care provider.  Eat a diet rich in iron. Foods high in iron include liver, lean beef, whole-grain bread, eggs, dried fruit, and dark green leafy vegetables. SEEK IMMEDIATE MEDICAL CARE IF:   You faint. If this happens, do not drive. Call your local emergency services (911 in [place] if no other help is available.  You have chest pain.  You feel nauseous or vomit.  You have severe or increased shortness of breath with activity.  You feel weak.  You have a rapid heartbeat.  You have unexplained sweating.  You become light-headed when getting up from a chair or bed. MAKE SURE YOU:   Understand these instructions.  Will watch your condition.  Will get help right away if you are not doing well or get worse.   This information is not intended to replace advice given to you by your health care provider. Make sure you discuss any questions you have with your health care provider.   Document Released: 09/26/2000 Document Revised: 10/20/2014 Document Reviewed: 06/06/2013 Elsevier Interactive Patient Education 2016 Elsevier Inc.  

## 2016-04-21 ENCOUNTER — Other Ambulatory Visit: Payer: Self-pay | Admitting: Obstetrics & Gynecology

## 2016-04-21 ENCOUNTER — Other Ambulatory Visit (HOSPITAL_COMMUNITY)
Admission: RE | Admit: 2016-04-21 | Discharge: 2016-04-21 | Disposition: A | Discharge status: Home / Self Care | Payer: Managed Care, Other (non HMO) | Source: Ambulatory Visit | Attending: Obstetrics & Gynecology | Admitting: Obstetrics & Gynecology

## 2016-04-21 DIAGNOSIS — Z01419 Encounter for gynecological examination (general) (routine) without abnormal findings: Secondary | ICD-10-CM | POA: Insufficient documentation

## 2016-04-21 DIAGNOSIS — Z1151 Encounter for screening for human papillomavirus (HPV): Secondary | ICD-10-CM | POA: Insufficient documentation

## 2016-04-23 LAB — CYTOLOGY - PAP

## 2016-05-22 ENCOUNTER — Ambulatory Visit: Payer: Managed Care, Other (non HMO) | Admitting: Hematology

## 2016-05-22 ENCOUNTER — Other Ambulatory Visit: Payer: Managed Care, Other (non HMO)

## 2017-10-13 DIAGNOSIS — S060XAA Concussion with loss of consciousness status unknown, initial encounter: Secondary | ICD-10-CM

## 2017-10-13 DIAGNOSIS — F411 Generalized anxiety disorder: Secondary | ICD-10-CM

## 2017-10-13 HISTORY — DX: Concussion with loss of consciousness status unknown, initial encounter: S06.0XAA

## 2017-10-13 HISTORY — DX: Generalized anxiety disorder: F41.1

## 2018-05-17 ENCOUNTER — Emergency Department (HOSPITAL_COMMUNITY)
Admission: EM | Admit: 2018-05-17 | Discharge: 2018-05-18 | Disposition: A | Discharge status: Home / Self Care | Payer: Managed Care, Other (non HMO) | Attending: Emergency Medicine | Admitting: Emergency Medicine

## 2018-05-17 ENCOUNTER — Encounter (HOSPITAL_COMMUNITY): Payer: Self-pay

## 2018-05-17 ENCOUNTER — Other Ambulatory Visit: Payer: Self-pay

## 2018-05-17 DIAGNOSIS — Y92410 Unspecified street and highway as the place of occurrence of the external cause: Secondary | ICD-10-CM | POA: Insufficient documentation

## 2018-05-17 DIAGNOSIS — Y998 Other external cause status: Secondary | ICD-10-CM | POA: Diagnosis not present

## 2018-05-17 DIAGNOSIS — Y939 Activity, unspecified: Secondary | ICD-10-CM | POA: Insufficient documentation

## 2018-05-17 DIAGNOSIS — S20211A Contusion of right front wall of thorax, initial encounter: Secondary | ICD-10-CM

## 2018-05-17 DIAGNOSIS — S301XXA Contusion of abdominal wall, initial encounter: Secondary | ICD-10-CM | POA: Diagnosis not present

## 2018-05-17 DIAGNOSIS — S0990XA Unspecified injury of head, initial encounter: Secondary | ICD-10-CM | POA: Diagnosis not present

## 2018-05-17 DIAGNOSIS — S161XXA Strain of muscle, fascia and tendon at neck level, initial encounter: Secondary | ICD-10-CM
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
## 2018-05-18 ENCOUNTER — Encounter (HOSPITAL_COMMUNITY): Payer: Self-pay

## 2018-05-18 ENCOUNTER — Emergency Department (HOSPITAL_COMMUNITY): Payer: Managed Care, Other (non HMO)

## 2018-05-18 LAB — I-STAT CHEM 8, ED
BUN: 10 mg/dL (ref 6–20)
CALCIUM ION: 1.21 mmol/L (ref 1.15–1.40)
CREATININE: 0.8 mg/dL (ref 0.44–1.00)
Chloride: 105 mmol/L (ref 98–111)
Glucose, Bld: 109 mg/dL — ABNORMAL HIGH (ref 70–99)
HCT: 30 % — ABNORMAL LOW (ref 36.0–46.0)
Hemoglobin: 10.2 g/dL — ABNORMAL LOW (ref 12.0–15.0)
Potassium: 3.8 mmol/L (ref 3.5–5.1)
Sodium: 140 mmol/L (ref 135–145)
TCO2: 23 mmol/L (ref 22–32)

## 2018-05-18 LAB — I-STAT BETA HCG BLOOD, ED (MC, WL, AP ONLY): I-stat hCG, quantitative: <5 m[IU]/mL (ref <5)

## 2018-05-18 MED ORDER — HYDROCODONE-ACETAMINOPHEN 5-325 MG PO TABS: 1.0000 | ORAL_TABLET | Freq: Four times a day (QID) | ORAL | 0 refills | Status: DC | PRN

## 2018-05-18 MED ORDER — MORPHINE SULFATE (PF) 4 MG/ML IV SOLN
4.0000 mg | Freq: Once | INTRAVENOUS | Status: AC
  Administered 2018-05-18: 4 mg via INTRAVENOUS
  Filled 2018-05-18: qty 1

## 2018-05-18 MED ORDER — IOPAMIDOL (ISOVUE-300) INJECTION 61%
100.0000 mL | Freq: Once | INTRAVENOUS | Status: AC | PRN
  Administered 2018-05-18: 100 mL via INTRAVENOUS

## 2018-05-18 MED ORDER — IOPAMIDOL (ISOVUE-300) INJECTION 61%
INTRAVENOUS | Status: AC
  Filled 2018-05-18: qty 100

## 2018-05-18 MED ORDER — ONDANSETRON HCL 4 MG/2ML IJ SOLN
4.0000 mg | Freq: Once | INTRAMUSCULAR | Status: AC
  Administered 2018-05-18: 4 mg via INTRAVENOUS
  Filled 2018-05-18: qty 2

## 2018-05-18 MED ORDER — SODIUM CHLORIDE 0.9 % IV BOLUS
1000.0000 mL | Freq: Once | INTRAVENOUS | Status: AC
  Administered 2018-05-18: 1000 mL via INTRAVENOUS

## 2018-05-18 MED ORDER — MORPHINE SULFATE (PF) 4 MG/ML IV SOLN
4.0000 mg | Freq: Once | INTRAVENOUS | Status: AC
Start: 2018-05-18 — End: 2018-05-18
  Administered 2018-05-18: 4 mg via INTRAVENOUS
  Filled 2018-05-18: qty 1
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
## 2018-05-18 NOTE — Discharge Instructions (Addendum)
Hydrocodone as prescribed as needed for pain.  Ice to affected areas for 20 minutes every 2 hours while awake for the next 2 days.  Follow-up with your primary doctor if not improving in the next week.

## 2018-05-20 ENCOUNTER — Other Ambulatory Visit: Payer: Self-pay

## 2018-05-20 ENCOUNTER — Encounter (HOSPITAL_COMMUNITY): Payer: Self-pay

## 2018-05-20 ENCOUNTER — Emergency Department (HOSPITAL_COMMUNITY)
Admission: EM | Admit: 2018-05-20 | Discharge: 2018-05-20 | Disposition: A | Discharge status: Home / Self Care | Payer: Managed Care, Other (non HMO) | Attending: Emergency Medicine | Admitting: Emergency Medicine

## 2018-05-20 DIAGNOSIS — S2002XD Contusion of left breast, subsequent encounter: Secondary | ICD-10-CM | POA: Diagnosis not present

## 2018-05-20 DIAGNOSIS — M62838 Other muscle spasm: Secondary | ICD-10-CM | POA: Diagnosis not present

## 2018-05-20 DIAGNOSIS — R51 Headache: Secondary | ICD-10-CM | POA: Insufficient documentation

## 2018-05-20 DIAGNOSIS — F17210 Nicotine dependence, cigarettes, uncomplicated: Secondary | ICD-10-CM | POA: Insufficient documentation

## 2018-05-20 DIAGNOSIS — Y929 Unspecified place or not applicable: Secondary | ICD-10-CM | POA: Insufficient documentation

## 2018-05-20 DIAGNOSIS — H538 Other visual disturbances: Secondary | ICD-10-CM | POA: Insufficient documentation

## 2018-05-20 DIAGNOSIS — Y999 Unspecified external cause status: Secondary | ICD-10-CM | POA: Diagnosis not present

## 2018-05-20 DIAGNOSIS — Z79899 Other long term (current) drug therapy: Secondary | ICD-10-CM | POA: Diagnosis not present

## 2018-05-20 DIAGNOSIS — Y939 Activity, unspecified: Secondary | ICD-10-CM | POA: Insufficient documentation

## 2018-05-20 DIAGNOSIS — R110 Nausea: Secondary | ICD-10-CM | POA: Insufficient documentation

## 2018-05-20 DIAGNOSIS — M549 Dorsalgia, unspecified: Secondary | ICD-10-CM | POA: Diagnosis not present

## 2018-05-20 DIAGNOSIS — Z041 Encounter for examination and observation following transport accident: Secondary | ICD-10-CM | POA: Diagnosis present

## 2018-05-20 DIAGNOSIS — T07XXXA Unspecified multiple injuries, initial encounter: Secondary | ICD-10-CM

## 2018-05-20 MED ORDER — CYCLOBENZAPRINE HCL 10 MG PO TABS: 10.0000 mg | ORAL_TABLET | Freq: Two times a day (BID) | ORAL | 0 refills | Status: DC | PRN

## 2018-05-20 MED ORDER — DICLOFENAC SODIUM 50 MG PO TBEC: 50.0000 mg | DELAYED_RELEASE_TABLET | Freq: Two times a day (BID) | ORAL | 0 refills | Status: DC

## 2018-05-20 MED ORDER — KETOROLAC TROMETHAMINE 60 MG/2ML IM SOLN
30.0000 mg | Freq: Once | INTRAMUSCULAR | Status: AC
Start: 2018-05-20 — End: 2018-05-20
  Administered 2018-05-20: 30 mg via INTRAMUSCULAR
  Filled 2018-05-20: qty 2

## 2018-05-20 MED ORDER — CYCLOBENZAPRINE HCL 10 MG PO TABS
10.0000 mg | ORAL_TABLET | Freq: Once | ORAL | Status: AC
  Administered 2018-05-20: 10 mg via ORAL
  Filled 2018-05-20: qty 1

## 2018-05-20 NOTE — Discharge Instructions (Addendum)
Take the medication for pain and inflammation and the muscle relaxer. Follow up with your doctor as planned. Return here as needed.
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
## 2018-06-02 ENCOUNTER — Emergency Department (HOSPITAL_COMMUNITY): Payer: Managed Care, Other (non HMO)

## 2018-06-02 ENCOUNTER — Emergency Department (HOSPITAL_COMMUNITY)
Admission: EM | Admit: 2018-06-02 | Discharge: 2018-06-02 | Disposition: A | Discharge status: Home / Self Care | Payer: Managed Care, Other (non HMO) | Attending: Emergency Medicine | Admitting: Emergency Medicine

## 2018-06-02 ENCOUNTER — Other Ambulatory Visit: Payer: Self-pay

## 2018-06-02 ENCOUNTER — Encounter (HOSPITAL_COMMUNITY): Payer: Self-pay | Admitting: Emergency Medicine

## 2018-06-02 DIAGNOSIS — F0781 Postconcussional syndrome: Secondary | ICD-10-CM

## 2018-06-02 DIAGNOSIS — D649 Anemia, unspecified: Secondary | ICD-10-CM

## 2018-06-02 DIAGNOSIS — F17210 Nicotine dependence, cigarettes, uncomplicated: Secondary | ICD-10-CM | POA: Diagnosis not present

## 2018-06-02 DIAGNOSIS — G44319 Acute post-traumatic headache, not intractable: Secondary | ICD-10-CM | POA: Insufficient documentation

## 2018-06-02 DIAGNOSIS — R51 Headache: Secondary | ICD-10-CM | POA: Diagnosis present

## 2018-06-02 LAB — CBC WITH DIFFERENTIAL/PLATELET
Basophils Absolute: 0.1 10*3/uL (ref 0.0–0.1)
Basophils Relative: 1 %
Eosinophils Absolute: 0.3 10*3/uL (ref 0.0–0.7)
Eosinophils Relative: 3 %
HEMATOCRIT: 29.4 % — AB (ref 36.0–46.0)
Hemoglobin: 7.9 g/dL — ABNORMAL LOW (ref 12.0–15.0)
LYMPHS ABS: 3.7 10*3/uL (ref 0.7–4.0)
Lymphocytes Relative: 38 %
MCH: 18.7 pg — AB (ref 26.0–34.0)
MCHC: 26.9 g/dL — ABNORMAL LOW (ref 30.0–36.0)
MCV: 69.7 fL — AB (ref 78.0–100.0)
MONOS PCT: 6 %
Monocytes Absolute: 0.5 10*3/uL (ref 0.1–1.0)
Neutro Abs: 5.1 10*3/uL (ref 1.7–7.7)
Neutrophils Relative %: 52 %
PLATELETS: 411 10*3/uL — AB (ref 150–400)
RBC: 4.22 MIL/uL (ref 3.87–5.11)
RDW: 20.1 % — ABNORMAL HIGH (ref 11.5–15.5)
WBC: 9.7 10*3/uL (ref 4.0–10.5)

## 2018-06-02 LAB — I-STAT BETA HCG BLOOD, ED (MC, WL, AP ONLY): I-stat hCG, quantitative: <5 m[IU]/mL (ref <5)

## 2018-06-02 LAB — COMPREHENSIVE METABOLIC PANEL
ALT: 13 U/L (ref 0–44)
AST: 17 U/L (ref 15–41)
Albumin: 3.6 g/dL (ref 3.5–5.0)
Alkaline Phosphatase: 51 U/L (ref 38–126)
Anion gap: 7 (ref 5–15)
BUN: 11 mg/dL (ref 6–20)
CHLORIDE: 108 mmol/L (ref 98–111)
CO2: 25 mmol/L (ref 22–32)
Calcium: 8.7 mg/dL — ABNORMAL LOW (ref 8.9–10.3)
Creatinine, Ser: 0.85 mg/dL (ref 0.44–1.00)
GFR calc non Af Amer: >60 mL/min (ref >60)
Glucose, Bld: 126 mg/dL — ABNORMAL HIGH (ref 70–99)
Potassium: 3.8 mmol/L (ref 3.5–5.1)
SODIUM: 140 mmol/L (ref 135–145)
Total Bilirubin: 0.4 mg/dL (ref 0.3–1.2)
Total Protein: 7.1 g/dL (ref 6.5–8.1)

## 2018-06-02 MED ORDER — IBUPROFEN 800 MG PO TABS: 800.0000 mg | ORAL_TABLET | Freq: Three times a day (TID) | ORAL | 0 refills | Status: DC | PRN

## 2018-06-02 MED ORDER — SODIUM CHLORIDE 0.9 % IV BOLUS
500.0000 mL | Freq: Once | INTRAVENOUS | Status: AC
  Administered 2018-06-02: 500 mL via INTRAVENOUS

## 2018-06-02 MED ORDER — KETOROLAC TROMETHAMINE 30 MG/ML IJ SOLN
30.0000 mg | Freq: Once | INTRAMUSCULAR | Status: AC
  Administered 2018-06-02: 30 mg via INTRAVENOUS
  Filled 2018-06-02: qty 1

## 2018-06-02 MED ORDER — METOCLOPRAMIDE HCL 5 MG/ML IJ SOLN
10.0000 mg | Freq: Once | INTRAMUSCULAR | Status: AC
  Administered 2018-06-02: 10 mg via INTRAVENOUS
  Filled 2018-06-02: qty 2

## 2018-06-02 MED ORDER — ONDANSETRON 4 MG PO TBDP: 4.0000 mg | ORAL_TABLET | Freq: Three times a day (TID) | ORAL | 0 refills | Status: DC | PRN

## 2018-06-02 MED ORDER — IOPAMIDOL (ISOVUE-370) INJECTION 76%
100.0000 mL | Freq: Once | INTRAVENOUS | Status: AC | PRN
  Administered 2018-06-02: 100 mL via INTRAVENOUS

## 2018-06-02 MED ORDER — IOPAMIDOL (ISOVUE-370) INJECTION 76%
INTRAVENOUS | Status: AC
  Filled 2018-06-02: qty 100
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
## 2018-06-02 NOTE — ED Notes (Signed)
Provided patient an ice cold Sprite with permission from Dr. Laverta Baltimore.

## 2018-06-02 NOTE — ED Notes (Signed)
ED Provider at bedside. 

## 2018-06-02 NOTE — Discharge Instructions (Signed)
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
## 2018-06-03 ENCOUNTER — Ambulatory Visit: Payer: Managed Care, Other (non HMO) | Admitting: Neurology

## 2018-07-28 ENCOUNTER — Encounter (HOSPITAL_COMMUNITY): Payer: Self-pay | Admitting: *Deleted

## 2018-07-28 ENCOUNTER — Inpatient Hospital Stay (HOSPITAL_COMMUNITY): Payer: Managed Care, Other (non HMO)

## 2018-07-28 ENCOUNTER — Inpatient Hospital Stay (HOSPITAL_COMMUNITY)
Admission: AD | Admit: 2018-07-28 | Discharge: 2018-07-28 | Disposition: A | Discharge status: Home / Self Care | Payer: Managed Care, Other (non HMO) | Source: Ambulatory Visit | Attending: Obstetrics and Gynecology | Admitting: Obstetrics and Gynecology

## 2018-07-28 ENCOUNTER — Other Ambulatory Visit: Payer: Self-pay

## 2018-07-28 DIAGNOSIS — F17210 Nicotine dependence, cigarettes, uncomplicated: Secondary | ICD-10-CM | POA: Diagnosis not present

## 2018-07-28 DIAGNOSIS — O418X10 Other specified disorders of amniotic fluid and membranes, first trimester, not applicable or unspecified: Secondary | ICD-10-CM

## 2018-07-28 DIAGNOSIS — R109 Unspecified abdominal pain: Secondary | ICD-10-CM | POA: Diagnosis present

## 2018-07-28 DIAGNOSIS — N83209 Unspecified ovarian cyst, unspecified side: Secondary | ICD-10-CM

## 2018-07-28 DIAGNOSIS — N83291 Other ovarian cyst, right side: Secondary | ICD-10-CM | POA: Insufficient documentation

## 2018-07-28 DIAGNOSIS — D72829 Elevated white blood cell count, unspecified: Secondary | ICD-10-CM

## 2018-07-28 DIAGNOSIS — Z79899 Other long term (current) drug therapy: Secondary | ICD-10-CM | POA: Diagnosis not present

## 2018-07-28 DIAGNOSIS — Z6841 Body Mass Index (BMI) 40.0 and over, adult: Secondary | ICD-10-CM | POA: Insufficient documentation

## 2018-07-28 DIAGNOSIS — D251 Intramural leiomyoma of uterus: Secondary | ICD-10-CM | POA: Diagnosis not present

## 2018-07-28 DIAGNOSIS — R110 Nausea: Secondary | ICD-10-CM | POA: Diagnosis not present

## 2018-07-28 DIAGNOSIS — N939 Abnormal uterine and vaginal bleeding, unspecified: Secondary | ICD-10-CM

## 2018-07-28 DIAGNOSIS — D219 Benign neoplasm of connective and other soft tissue, unspecified: Secondary | ICD-10-CM

## 2018-07-28 DIAGNOSIS — E669 Obesity, unspecified: Secondary | ICD-10-CM | POA: Diagnosis not present

## 2018-07-28 DIAGNOSIS — Z3491 Encounter for supervision of normal pregnancy, unspecified, first trimester: Secondary | ICD-10-CM

## 2018-07-28 DIAGNOSIS — O209 Hemorrhage in early pregnancy, unspecified: Secondary | ICD-10-CM

## 2018-07-28 DIAGNOSIS — O468X1 Other antepartum hemorrhage, first trimester: Secondary | ICD-10-CM

## 2018-07-28 LAB — POCT PREGNANCY, URINE: Preg Test, Ur: NEGATIVE

## 2018-07-28 LAB — CBC WITH DIFFERENTIAL/PLATELET
Basophils Absolute: 0 10*3/uL (ref 0.0–0.1)
Basophils Relative: 0 %
EOS ABS: 0.3 10*3/uL (ref 0.0–0.5)
EOS PCT: 2 %
HCT: 36.3 % (ref 36.0–46.0)
Hemoglobin: 10.6 g/dL — ABNORMAL LOW (ref 12.0–15.0)
LYMPHS ABS: 3.3 10*3/uL (ref 0.7–4.0)
LYMPHS PCT: 24 %
MCH: 23.7 pg — AB (ref 26.0–34.0)
MCHC: 29.2 g/dL — AB (ref 30.0–36.0)
MCV: 81 fL (ref 80.0–100.0)
MONO ABS: 0.4 10*3/uL (ref 0.1–1.0)
MONOS PCT: 3 %
Neutro Abs: 10.2 10*3/uL — ABNORMAL HIGH (ref 1.7–7.7)
Neutrophils Relative %: 71 %
PLATELETS: 314 10*3/uL (ref 150–400)
RBC: 4.48 MIL/uL (ref 3.87–5.11)
RDW: 20.6 % — ABNORMAL HIGH (ref 11.5–15.5)
WBC: 14.2 10*3/uL — ABNORMAL HIGH (ref 4.0–10.5)
nRBC: 0 % (ref 0.0–0.2)

## 2018-07-28 LAB — URINALYSIS, ROUTINE W REFLEX MICROSCOPIC
BILIRUBIN URINE: NEGATIVE
GLUCOSE, UA: NEGATIVE mg/dL
KETONES UR: NEGATIVE mg/dL
Nitrite: NEGATIVE
PH: 5 (ref 5.0–8.0)
PROTEIN: NEGATIVE mg/dL
Specific Gravity, Urine: 1.03 (ref 1.005–1.030)

## 2018-07-28 MED ORDER — KETOROLAC TROMETHAMINE 10 MG PO TABS: 10.0000 mg | ORAL_TABLET | Freq: Four times a day (QID) | ORAL | 0 refills | Status: AC | PRN

## 2018-07-28 MED ORDER — ONDANSETRON 8 MG PO TBDP: 8.0000 mg | ORAL_TABLET | Freq: Three times a day (TID) | ORAL | 0 refills | Status: DC | PRN

## 2018-07-28 MED ORDER — KETOROLAC TROMETHAMINE 60 MG/2ML IM SOLN
60.0000 mg | Freq: Once | INTRAMUSCULAR | Status: AC
  Administered 2018-07-28: 60 mg via INTRAMUSCULAR
  Filled 2018-07-28: qty 2

## 2018-07-28 NOTE — Discharge Instructions (Signed)

## 2018-07-28 NOTE — MAU Provider Note (Addendum)
History     CSN: [id number]  Arrival date and time: 07/28/18 1736   First Provider Initiated Contact with Patient 07/28/18 1850      Chief Complaint  Patient presents with  . Abdominal Pain  . Nausea  . Vaginal Bleeding   HPI   [name] is a 32 y.o. female [id number] non pregnant female, patient of Dr. Nelda Marseille here in MAU with complaints of vaginal bleeding and abdominal pain. She was seen by her Dr. On Monday and had an [place] which showed fibroids and ? Ovarian cysts. She is currently taking BC pills however would like to stop those at some point to get pregnant. Her LMP was first week of Sept and lasted 1 week. The bleeding started again at the end of September and has continued off and on since then. She attests to very light bleeding, only using 1 pad per day. She is not taking anything for there abdominal pain. The pain is in her lower part of her abdomen and radiate around to her back. + nausea   OB History    Gravida  1   Para      Term      Preterm      AB  1   Living        SAB      TAB  1   Ectopic      Multiple      Live Births              Past Medical History:  Diagnosis Date  . Anemia   . Fibroids   . Obesity, Class III, BMI 40-49.9 (morbid obesity) (HCC)    BMI 40  . Recurrent UTI (urinary tract infection)   . Shortness of breath    "sometimes when I have a cold"  . Spasm of lung air passages     Past Surgical History:  Procedure Laterality Date  . left arm surgey    . ROBOT ASSISTED MYOMECTOMY  07/12/2012   Procedure: ROBOTIC ASSISTED MYOMECTOMY;  Surgeon: Lavonia Drafts, MD;  Location: [facility];  Service: Gynecology;  Laterality: N/A;  . THERAPEUTIC ABORTION      Family History  Problem Relation Age of Onset  . Fibroids Mother   . Hypertension Mother     Social History   Tobacco Use  . Smoking status: Current Every Day Smoker    Packs/day: 0.25    Types: Cigarettes  . Smokeless tobacco: Never Used  Substance Use  Topics  . Alcohol use: Not Currently    Comment: occasionally  . Drug use: Not Currently    Types: Marijuana    Comment: patient states, "once in a while per month"    Allergies: No Known Allergies  Medications Prior to Admission  Medication Sig Dispense Refill Last Dose  . baclofen (LIORESAL) 10 MG tablet Take 10 mg by mouth 2 (two) times daily.  1 Past Week at Unknown time  . cetirizine (ZYRTEC) 10 MG tablet Take 10 mg by mouth daily as needed for allergies.   Past Week at Unknown time  . cyclobenzaprine (FLEXERIL) 10 MG tablet Take 1 tablet (10 mg total) by mouth 2 (two) times daily as needed for muscle spasms. 20 tablet 0 Past Week at Unknown time  . diclofenac (VOLTAREN) 50 MG EC tablet Take 1 tablet (50 mg total) by mouth 2 (two) times daily. 15 tablet 0 Past Week at Unknown time  . ESTARYLLA 0.25-35 MG-MCG tablet Take 1  tablet by mouth daily.   06/02/2018 at Unknown time  . ferrous sulfate 325 (65 FE) MG tablet Take 650 mg by mouth daily with breakfast.   06/02/2018 at Unknown time  . fluticasone (FLONASE) 50 MCG/ACT nasal spray Place 2 sprays into both nostrils daily as needed for allergies or rhinitis.   Past Week at Unknown time  . HYDROcodone-acetaminophen (NORCO) 5-325 MG tablet Take 1-2 tablets by mouth every 6 (six) hours as needed. 12 tablet 0 Past Week at Unknown time  . ibuprofen (ADVIL,MOTRIN) 800 MG tablet Take 1 tablet (800 mg total) by mouth every 8 (eight) hours as needed. 21 tablet 0   . ondansetron (ZOFRAN ODT) 4 MG disintegrating tablet Take 1 tablet (4 mg total) by mouth every 8 (eight) hours as needed for nausea or vomiting. 20 tablet 0   . valACYclovir (VALTREX) 500 MG tablet Take 500 mg by mouth daily as needed (outbreaks).   6 months ago   Results for orders placed or performed during the hospital encounter of 07/28/18 (from the past 48 hour(s))  Urinalysis, Routine w reflex microscopic     Status: Abnormal   Collection Time: 07/28/18  6:43 PM  Result Value Ref  Range   Color, Urine YELLOW YELLOW   APPearance CLEAR CLEAR   Specific Gravity, Urine 1.030 1.005 - 1.030   pH 5.0 5.0 - 8.0   Glucose, UA NEGATIVE NEGATIVE mg/dL   Hgb urine dipstick MODERATE (A) NEGATIVE   Bilirubin Urine NEGATIVE NEGATIVE   Ketones, ur NEGATIVE NEGATIVE mg/dL   Protein, ur NEGATIVE NEGATIVE mg/dL   Nitrite NEGATIVE NEGATIVE   Leukocytes, UA SMALL (A) NEGATIVE   RBC / HPF 0-5 0 - 5 RBC/hpf   WBC, UA 0-5 0 - 5 WBC/hpf   Bacteria, UA RARE (A) NONE SEEN   Squamous Epithelial / LPF 0-5 0 - 5   Mucus PRESENT     Comment: Performed at [facility], [address], [place], [place] [address]  Pregnancy, urine POC     Status: None   Collection Time: 07/28/18  6:45 PM  Result Value Ref Range   Preg Test, Ur NEGATIVE NEGATIVE    Comment:        THE SENSITIVITY OF THIS METHODOLOGY IS >24 mIU/mL   CBC with Differential     Status: Abnormal   Collection Time: 07/28/18  7:10 PM  Result Value Ref Range   WBC 14.2 (H) 4.0 - 10.5 K/uL   RBC 4.48 3.87 - 5.11 MIL/uL   Hemoglobin 10.6 (L) 12.0 - 15.0 g/dL   HCT 36.3 36.0 - 46.0 %   MCV 81.0 80.0 - 100.0 fL   MCH 23.7 (L) 26.0 - 34.0 pg   MCHC 29.2 (L) 30.0 - 36.0 g/dL   RDW 20.6 (H) 11.5 - 15.5 %   Platelets 314 150 - 400 K/uL   nRBC 0.0 0.0 - 0.2 %   Neutrophils Relative % 71 %   Neutro Abs 10.2 (H) 1.7 - 7.7 K/uL   Lymphocytes Relative 24 %   Lymphs Abs 3.3 0.7 - 4.0 K/uL   Monocytes Relative 3 %   Monocytes Absolute 0.4 0.1 - 1.0 K/uL   Eosinophils Relative 2 %   Eosinophils Absolute 0.3 0.0 - 0.5 K/uL   Basophils Relative 0 %   Basophils Absolute 0.0 0.0 - 0.1 K/uL    Comment: Performed at [facility], [address] [place],  [address]   Review of Systems  Constitutional: Negative for fever.  Gastrointestinal: Positive for abdominal pain and nausea.  Genitourinary: Positive for vaginal bleeding.   Physical Exam   Blood pressure 135/88, pulse 94, temperature 98.7 F (37.1 C), temperature  source Oral, resp. rate 17, weight (!) 139.8 kg, last menstrual period 07/10/2018, SpO2 100 %.  Physical Exam  Constitutional: She is oriented to person, place, and time. She appears well-developed and well-nourished. No distress.  HENT:  Head: Normocephalic.  GI: Soft. She exhibits no distension. There is no tenderness. There is no rebound.  Genitourinary:  Genitourinary Comments: Vagina - Scant amount of dark red vaginal bleeding, no odor  Cervix - No contact bleeding, no active bleeding  Bimanual exam: Cervix closed Uterus non tender, normal size Left Adnexal tenderness, technically difficult due to habitus  no masses bilaterally Chaperone present for exam.   Musculoskeletal: Normal range of motion.  Neurological: She is alert and oriented to person, place, and time.  Skin: Skin is warm. She is not diaphoretic.  Psychiatric: Her behavior is normal.   US Pelvic Complete W Transvaginal And Torsion R/o  Result Date: 07/28/2018 CLINICAL DATA:  Initial evaluation for ongoing pain with spotting since last menstrual period. Leukocytosis. History of fibroids. EXAM: TRANSABDOMINAL AND TRANSVAGINAL ULTRASOUND OF PELVIS DOPPLER ULTRASOUND OF OVARIES TECHNIQUE: Both transabdominal and transvaginal ultrasound examinations of the pelvis were performed. Transabdominal technique was performed for global imaging of the pelvis including uterus, ovaries, adnexal regions, and pelvic cul-de-sac. It was necessary to proceed with endovaginal exam following the transabdominal exam to visualize the uterus, endometrium, and ovaries. Color and duplex Doppler ultrasound was utilized to evaluate blood flow to the ovaries. COMPARISON:  Prior CT from 05/18/2018. FINDINGS: Uterus Measurements: 11.3 x 7.4 x 6.6 cm. 3.3 x 3.0 x 3.5 cm intramural fibroid present at the right uterine fundus. 3.3 x 2.3 x 2.8 cm exophytic fibroid extends from the adjacent uterine fundus. Endometrium Thickness: 6 mm.  No focal abnormality  visualized. Right ovary Measurements: 4.3 x 1.8 x 2.4 cm. 2.5 x 2.5 x 2.2 cm complex cyst with internal lace-like architecture, most consistent with a small hemorrhagic cyst. Left ovary Measurements: 4.4 x 3.3 x 3.0 cm. Normal appearance/no adnexal mass. Other findings Trace free fluid within the pelvis. Doppler interrogation of the ovaries demonstrates normal arterial and venous waveforms bilaterally. IMPRESSION: 1. 2.5 cm complex right ovarian cyst, with imaging features most consistent with a small benign hemorrhagic cyst. 2. Fibroid uterus as above. 3. No other acute abnormality within the pelvis. No evidence for torsion. Electronically Signed   By: Jeannine Boga M.D.   On: 07/28/2018 21:15   MAU Course  Procedures  None  MDM  CBC with diff Toradol 60 mg IM Leukocytosis noted on CBC> New finding. Pelvic US ordered. Discussed with Dr. Nehemiah Settle. Report given to Marcille Buffy CNM who resumes care of the patient> patient in [place].   Rasch, Artist Pais, NP  Patient reports that pain has improved with toradol. She has an appointment in the office on Monday. Patient to continue current prescribed treatment, and FU with the office as planned.    Assessment and Plan   1. Hemorrhagic cyst of the ovary 2. Abnormal uterine bleeding 3. Fibroid uterus   DC home Comfort measures reviewed  RX: toradol PO q 6 hours x 3 days, Zofran PRN #20  Return to MAU as needed FU with GYN as scheduled for Monday 08/02/18  Follow-up Information    Gynecology, [facility].   Specialty:  Obstetrics and Gynecology Contact  information: [address] [place] [address] [phone]

## 2018-07-28 NOTE — MAU Note (Signed)
Has been bleeding off and on since Sept 28, came on when expected, but has continued.bleeding off and on and passing "clocks".  Saw MD last wk, treated for BV.  Had [place], has cyst and fibroids.  Having pain on left side, ongoing stomach ache.  Pain is making her nauseated.

## 2019-04-03 ENCOUNTER — Emergency Department (HOSPITAL_BASED_OUTPATIENT_CLINIC_OR_DEPARTMENT_OTHER)
Admission: EM | Admit: 2019-04-03 | Discharge: 2019-04-03 | Disposition: A | Discharge status: Home / Self Care | Payer: Managed Care, Other (non HMO) | Attending: Emergency Medicine | Admitting: Emergency Medicine

## 2019-04-03 ENCOUNTER — Other Ambulatory Visit: Payer: Self-pay

## 2019-04-03 ENCOUNTER — Encounter (HOSPITAL_BASED_OUTPATIENT_CLINIC_OR_DEPARTMENT_OTHER): Payer: Self-pay | Admitting: Emergency Medicine

## 2019-04-03 DIAGNOSIS — J111 Influenza due to unidentified influenza virus with other respiratory manifestations: Secondary | ICD-10-CM | POA: Diagnosis not present

## 2019-04-03 DIAGNOSIS — F17210 Nicotine dependence, cigarettes, uncomplicated: Secondary | ICD-10-CM | POA: Diagnosis not present

## 2019-04-03 DIAGNOSIS — R51 Headache: Secondary | ICD-10-CM | POA: Diagnosis present

## 2019-04-03 DIAGNOSIS — Z20828 Contact with and (suspected) exposure to other viral communicable diseases: Secondary | ICD-10-CM | POA: Insufficient documentation

## 2019-04-03 NOTE — Discharge Instructions (Signed)
Person Under Monitoring Name: Yvonne [name]  Location: [address] [place] [address]   Infection Prevention Recommendations for Individuals Confirmed to have, or Being Evaluated for, 2019 Novel Coronavirus (COVID-19) Infection Who Receive Care at Home  Individuals who are confirmed to have, or are being evaluated for, COVID-19 should follow the prevention steps below until a healthcare provider or local or state health department says they can return to normal activities.  Stay home except to get medical care You should restrict activities outside your home, except for getting medical care. Do not go to work, school, or public areas, and do not use public transportation or taxis.  Call ahead before visiting your doctor Before your medical appointment, call the healthcare provider and tell them that you have, or are being evaluated for, COVID-19 infection. This will help the healthcare providers office take steps to keep other people from getting infected. Ask your healthcare provider to call the local or state health department.  Monitor your symptoms Seek prompt medical attention if your illness is worsening (e.g., difficulty breathing). Before going to your medical appointment, call the healthcare provider and tell them that you have, or are being evaluated for, COVID-19 infection. Ask your healthcare provider to call the local or state health department.  Wear a facemask You should wear a facemask that covers your nose and mouth when you are in the same room with other people and when you visit a healthcare provider. People who live with or visit you should also wear a facemask while they are in the same room with you.  Separate yourself from other people in your home As much as possible, you should stay in a different room from other people in your home. Also, you should use a separate bathroom, if available.  Avoid sharing household items You should not share  dishes, drinking glasses, cups, eating utensils, towels, bedding, or other items with other people in your home. After using these items, you should wash them thoroughly with soap and water.  Cover your coughs and sneezes Cover your mouth and nose with a tissue when you cough or sneeze, or you can cough or sneeze into your sleeve. Throw used tissues in a lined trash can, and immediately wash your hands with soap and water for at least 20 seconds or use an alcohol-based hand rub.  Wash your [facility] your hands often and thoroughly with soap and water for at least 20 seconds. You can use an alcohol-based hand sanitizer if soap and water are not available and if your hands are not visibly dirty. Avoid touching your eyes, nose, and mouth with unwashed hands.   Prevention Steps for Caregivers and Household Members of Individuals Confirmed to have, or Being Evaluated for, COVID-19 Infection Being Cared for in the Home  If you live with, or provide care at home for, a person confirmed to have, or being evaluated for, COVID-19 infection please follow these guidelines to prevent infection:  Follow healthcare providers instructions Make sure that you understand and can help the patient follow any healthcare provider instructions for all care.  Provide for the patients basic needs You should help the patient with basic needs in the home and provide support for getting groceries, prescriptions, and other personal needs.  Monitor the patients symptoms If they are getting sicker, call his or her medical provider and tell them that the patient has, or is being evaluated for, COVID-19 infection. This will help the healthcare providers office  take steps to keep other people from getting infected. °Ask the healthcare provider to call the local or state health department. ° °Limit the number of people who have contact with the patient °If possible, have only one caregiver for the patient. °Other  household members should stay in another home or place of residence. If this is not possible, they should stay °in another room, or be separated from the patient as much as possible. Use a separate bathroom, if available. °Restrict visitors who do not have an essential need to be in the home. ° °Keep older adults, very young children, and other sick people away from the patient °Keep older adults, very young children, and those who have compromised immune systems or chronic health conditions away from the patient. This includes people with chronic heart, lung, or kidney conditions, diabetes, and cancer. ° °Ensure good ventilation °Make sure that shared spaces in the home have good air flow, such as from an air conditioner or an opened window, °weather permitting. ° °Wash your hands often °Wash your hands often and thoroughly with soap and water for at least 20 seconds. You can use an alcohol based hand sanitizer if soap and water are not available and if your hands are not visibly dirty. °Avoid touching your eyes, nose, and mouth with unwashed hands. °Use disposable paper towels to dry your hands. If not available, use dedicated cloth towels and replace them when they become wet. ° °Wear a facemask and gloves °Wear a disposable facemask at all times in the room and gloves when you touch or have contact with the patient’s blood, body fluids, and/or secretions or excretions, such as sweat, saliva, sputum, nasal mucus, vomit, urine, or feces.  Ensure the mask fits over your nose and mouth tightly, and do not touch it during use. °Throw out disposable facemasks and gloves after using them. Do not reuse. °Wash your hands immediately after removing your facemask and gloves. °If your personal clothing becomes contaminated, carefully remove clothing and launder. Wash your hands after handling contaminated clothing. °Place all used disposable facemasks, gloves, and other waste in a lined container before disposing them with  other household waste. °Remove gloves and wash your hands immediately after handling these items. ° °Do not share dishes, glasses, or other household items with the patient °Avoid sharing household items. You should not share dishes, drinking glasses, cups, eating utensils, towels, bedding, or other items with a patient who is confirmed to have, or being evaluated for, COVID-19 infection. °After the person uses these items, you should wash them thoroughly with soap and water. ° °Wash laundry thoroughly °Immediately remove and wash clothes or bedding that have blood, body fluids, and/or secretions or excretions, such as sweat, saliva, sputum, nasal mucus, vomit, urine, or feces, on them. °Wear gloves when handling laundry from the patient. °Read and follow directions on labels of laundry or clothing items and detergent. In general, wash and dry with the warmest temperatures recommended on the label. ° °Clean all areas the individual has used often °Clean all touchable surfaces, such as counters, tabletops, doorknobs, bathroom fixtures, toilets, phones, keyboards, tablets, and bedside tables, every day. Also, clean any surfaces that may have blood, body fluids, and/or secretions or excretions on them. °Wear gloves when cleaning surfaces the patient has come in contact with. °Use a diluted bleach solution (e.g., dilute bleach with 1 part bleach and 10 parts water) or a household disinfectant with a label that says EPA-registered for coronaviruses. To make a bleach   solution at home, add 1 tablespoon of bleach to 1 quart (4 cups) of water. For a larger supply, add  cup of bleach to 1 gallon (16 cups) of water. Read labels of cleaning products and follow recommendations provided on product labels. Labels contain instructions for safe and effective use of the cleaning product including precautions you should take when applying the product, such as wearing gloves or eye protection and making sure you have good ventilation  during use of the product. Remove gloves and wash hands immediately after cleaning.  Monitor yourself for signs and symptoms of illness Caregivers and household members are considered close contacts, should monitor their health, and will be asked to limit movement outside of the home to the extent possible. Follow the monitoring steps for close contacts listed on the symptom monitoring form.   ? If you have additional questions, contact your local health department or call the epidemiologist on call at [phone] (available 24/7). ? This guidance is subject to change. For the most up-to-date guidance from [facility], please refer to their website: YouBlogs.pl

## 2019-04-03 NOTE — ED Triage Notes (Addendum)
Patient states that her husband and his mom tested positive to COVID  - 19  - she has been in contact with both of them earlier this week. She reports that she has had SOB for 2 days and earlier this week she had some loose stools - noted body aches by the patient - patient in no noted distress
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
## 2019-04-05 LAB — NOVEL CORONAVIRUS, NAA (HOSP ORDER, SEND-OUT TO REF LAB; TAT 18-24 HRS): SARS-CoV-2, NAA: NOT DETECTED

## 2019-06-24 ENCOUNTER — Emergency Department (HOSPITAL_BASED_OUTPATIENT_CLINIC_OR_DEPARTMENT_OTHER): Payer: Managed Care, Other (non HMO)

## 2019-06-24 ENCOUNTER — Emergency Department (HOSPITAL_BASED_OUTPATIENT_CLINIC_OR_DEPARTMENT_OTHER)
Admission: EM | Admit: 2019-06-24 | Discharge: 2019-06-24 | Disposition: A | Discharge status: Home / Self Care | Payer: Managed Care, Other (non HMO) | Attending: Emergency Medicine | Admitting: Emergency Medicine

## 2019-06-24 ENCOUNTER — Other Ambulatory Visit: Payer: Self-pay

## 2019-06-24 ENCOUNTER — Encounter (HOSPITAL_BASED_OUTPATIENT_CLINIC_OR_DEPARTMENT_OTHER): Payer: Self-pay | Admitting: *Deleted

## 2019-06-24 DIAGNOSIS — H6691 Otitis media, unspecified, right ear: Secondary | ICD-10-CM | POA: Diagnosis not present

## 2019-06-24 DIAGNOSIS — I517 Cardiomegaly: Secondary | ICD-10-CM | POA: Diagnosis not present

## 2019-06-24 DIAGNOSIS — J0141 Acute recurrent pansinusitis: Secondary | ICD-10-CM | POA: Insufficient documentation

## 2019-06-24 DIAGNOSIS — Z20828 Contact with and (suspected) exposure to other viral communicable diseases: Secondary | ICD-10-CM | POA: Diagnosis not present

## 2019-06-24 DIAGNOSIS — Z87891 Personal history of nicotine dependence: Secondary | ICD-10-CM | POA: Diagnosis not present

## 2019-06-24 DIAGNOSIS — Z79899 Other long term (current) drug therapy: Secondary | ICD-10-CM | POA: Insufficient documentation

## 2019-06-24 DIAGNOSIS — J3489 Other specified disorders of nose and nasal sinuses: Secondary | ICD-10-CM | POA: Diagnosis present

## 2019-06-24 MED ORDER — NAPROXEN 250 MG PO TABS
500.0000 mg | ORAL_TABLET | Freq: Once | ORAL | Status: AC
  Administered 2019-06-24: 500 mg via ORAL
  Filled 2019-06-24: qty 2

## 2019-06-24 MED ORDER — HYDROCODONE-ACETAMINOPHEN 5-325 MG PO TABS
1.0000 | ORAL_TABLET | Freq: Once | ORAL | Status: AC
  Administered 2019-06-24: 1 via ORAL
  Filled 2019-06-24: qty 1

## 2019-06-24 MED ORDER — ACETAMINOPHEN 500 MG PO TABS
1000.0000 mg | ORAL_TABLET | Freq: Once | ORAL | Status: AC
  Administered 2019-06-24: 20:00:00 1000 mg via ORAL
  Filled 2019-06-24: qty 2

## 2019-06-24 MED ORDER — AMOXICILLIN-POT CLAVULANATE 875-125 MG PO TABS
1.0000 | ORAL_TABLET | Freq: Once | ORAL | Status: AC
  Administered 2019-06-24: 20:00:00 1 via ORAL
  Filled 2019-06-24: qty 1

## 2019-06-24 MED ORDER — AMOXICILLIN-POT CLAVULANATE 875-125 MG PO TABS: 1.0000 | ORAL_TABLET | Freq: Two times a day (BID) | ORAL | 0 refills | Status: DC

## 2019-06-24 NOTE — Discharge Instructions (Signed)
Please take antibiotics for sinusitis and ear infection as directed use ibuprofen or naproxen as well as Tylenol to help with pain.  Given your recurrent sinus infections you should follow-up with ENT.  Your chest x-ray shows no evidence of pneumonia, does show that your heart is slightly enlarged, no other concerning findings on your chest x-ray but I would like for you to follow-up with your primary care doctor regarding this.

## 2019-06-24 NOTE — ED Provider Notes (Signed)
Yvonne [name]   CSN: [id number] Arrival date & time: 06/24/19  1833     History   Chief Complaint Chief Complaint  Patient presents with  . Sinus Problem    HPI Yvonne [name] is a 33 y.o. female.     Yvonne [name] is a 33 y.o. female with a history of recurrent sinusitis, anemia, obesity, uterine fibroids, who presents to the emergency department today for evaluation of 2 weeks of worsening sinus pressure.  She reports symptoms started as what she thought was a mild sinus infection but they have continued to worsen over the past 2 weeks.  She has had some intermittent chills but no measured fevers.  She reports over the past few days she has noticed the pain is now radiating into her right ear and is making her right upper molars hurt as well.  She reports that she has a constant throbbing pain and feels like the sinus pressure in her forehead and over her maxillary sinuses going to make her face explode.  She has not been able to sleep due to the pain.  Has been taking Motrin Tylenol without improvement.  Patient reports she has been taking Zyrtec without much improvement.  Has not tried anything else for decongestant.  Does reports that she has an occasional cough, and reports feeling some chest congestion but no chest pain or shortness of breath.  No known sick contacts.  Reports history of recurrent sinus infections, has never seen ENT.  Most recently was placed on antibiotics for sinus infection back in May, she reports she thinks she took cefdinir.     Past Medical History:  Diagnosis Date  . Anemia   . Fibroids   . Obesity, Class III, BMI 40-49.9 (morbid obesity) (HCC)    BMI 40  . Recurrent UTI (urinary tract infection)   . Shortness of breath    "sometimes when I have a cold"  . Spasm of lung air passages     Patient Active Problem List   Diagnosis Date Noted  . Iron deficiency anemia due to chronic blood loss 04/24/2015  .  Morbid obesity ([place]) 04/24/2015  . Well woman exam with routine gynecological exam 07/15/2013  . Counseling for birth control, oral contraceptives 07/15/2013  . Fibroids 02/05/2012  . Menorrhagia 02/05/2012  . Symptomatic anemia due to menorrhagia 02/05/2012  . Red blood cell antibody positive 02/05/2012    Past Surgical History:  Procedure Laterality Date  . left arm surgey    . ROBOT ASSISTED MYOMECTOMY  07/12/2012   Procedure: ROBOTIC ASSISTED MYOMECTOMY;  Surgeon: Lavonia Drafts, MD;  Location: [facility];  Service: Gynecology;  Laterality: N/A;  . THERAPEUTIC ABORTION       OB History    Gravida  1   Para      Term      Preterm      AB  1   Living        SAB      TAB  1   Ectopic      Multiple      Live Births               Home Medications    Prior to Admission medications   Medication Sig Start Date End Date Taking? Authorizing Provider  baclofen (LIORESAL) 10 MG tablet Take 10 mg by mouth 2 (two) times daily. 05/25/18   [provider]  cetirizine (ZYRTEC) 10 MG tablet Take  10 mg by mouth daily as needed for allergies.    [provider]  cyclobenzaprine (FLEXERIL) 10 MG tablet Take 1 tablet (10 mg total) by mouth 2 (two) times daily as needed for muscle spasms. 05/20/18   Ashley Murrain, NP  diclofenac (VOLTAREN) 50 MG EC tablet Take 1 tablet (50 mg total) by mouth 2 (two) times daily. 05/20/18   Ashley Murrain, NP  ESTARYLLA 0.25-35 MG-MCG tablet Take 1 tablet by mouth daily. 03/11/18   [provider]  ferrous sulfate 325 (65 FE) MG tablet Take 650 mg by mouth daily with breakfast.    [provider]  fluticasone (FLONASE) 50 MCG/ACT nasal spray Place 2 sprays into both nostrils daily as needed for allergies or rhinitis.    [provider]  HYDROcodone-acetaminophen (NORCO) 5-325 MG tablet Take 1-2 tablets by mouth every 6 (six) hours as needed. 05/18/18   Veryl Speak, MD  ibuprofen (ADVIL,MOTRIN) 800 MG  tablet Take 1 tablet (800 mg total) by mouth every 8 (eight) hours as needed. 06/02/18   Long, Wonda Olds, MD  ondansetron (ZOFRAN ODT) 8 MG disintegrating tablet Take 1 tablet (8 mg total) by mouth every 8 (eight) hours as needed for nausea or vomiting. 07/28/18   Tresea Mall, CNM  valACYclovir (VALTREX) 500 MG tablet Take 500 mg by mouth daily as needed (outbreaks).    [provider]    Family History Family History  Problem Relation Age of Onset  . Fibroids Mother   . Hypertension Mother     Social History Social History   Tobacco Use  . Smoking status: Former Smoker    Packs/day: 0.25    Types: Cigarettes    Quit date: 04/26/2019    Years since quitting: 0.1  . Smokeless tobacco: Never Used  Substance Use Topics  . Alcohol use: Not Currently    Comment: occasionally  . Drug use: Not Currently    Types: Marijuana    Comment: patient states, "once in a while per month"     Allergies   Patient has no known allergies.   Review of Systems Review of Systems  Constitutional: Positive for chills. Negative for fever.  HENT: Positive for congestion, ear pain, postnasal drip, rhinorrhea, sinus pressure and sinus pain. Negative for sore throat.   Eyes: Negative for visual disturbance.  Respiratory: Positive for cough. Negative for shortness of breath.   Cardiovascular: Negative for chest pain.  Gastrointestinal: Negative for abdominal pain, nausea and vomiting.  Musculoskeletal: Negative for arthralgias and myalgias.  Neurological: Positive for headaches.     Physical Exam Updated Vital Signs BP 110/69 (BP Location: Left Arm)   Pulse 86   Temp 98.8 F (37.1 C) (Oral)   Resp 18   Ht 5\' 8"  (1.727 m)   Wt (!) 152 kg   LMP 06/16/2019   SpO2 100%   BMI 50.94 kg/m   Physical Exam Vitals signs and nursing [name] reviewed.  Constitutional:      General: She is not in acute distress.    Appearance: Normal appearance. She is well-developed. She is obese. She  is not ill-appearing or diaphoretic.  HENT:     Head: Normocephalic and atraumatic.     Ears:     Comments: Right TM dull and erythematous, bulging, no mastoid tenderness, no auricular swelling or erythema. Left TM and auricle umremarkable    Nose: Congestion and rhinorrhea present.     Comments: Nasal membranes erythematous and edematous with rhinorrhea present.  Tenderness over the frontal and maxillary sinuses.    Mouth/Throat:     Comments: Patient reports pain over the right upper molars, there is no erythema of the gums, no drainable abscess, no sublingual tenderness, posterior oropharynx is clear and moist. Eyes:     General:        Right eye: No discharge.        Left eye: No discharge.  Neck:     Musculoskeletal: Neck supple.     Comments: No rigidity. No lymphadenopathy Cardiovascular:     Rate and Rhythm: Normal rate and regular rhythm.     Heart sounds: Normal heart sounds.  Pulmonary:     Effort: Pulmonary effort is normal. No respiratory distress.     Breath sounds: Normal breath sounds.     Comments: Respirations equal and unlabored, patient able to speak in full sentences, lungs clear to auscultation bilaterally Abdominal:     General: Bowel sounds are normal. There is no distension.     Palpations: Abdomen is soft. There is no mass.     Tenderness: There is no abdominal tenderness. There is no guarding.     Comments: Abdomen soft, nondistended, nontender to palpation in all quadrants without guarding or peritoneal signs  Musculoskeletal:        General: No deformity.  Lymphadenopathy:     Cervical: No cervical adenopathy.  Skin:    General: Skin is warm and dry.     Capillary Refill: Capillary refill takes less than 2 seconds.  Neurological:     Mental Status: She is alert and oriented to person, place, and time.  Psychiatric:        Mood and Affect: Mood normal.        Behavior: Behavior normal.      ED Treatments / Results  Labs (all labs ordered are  listed, but only abnormal results are displayed) Labs Reviewed  NOVEL CORONAVIRUS, NAA (HOSP ORDER, SEND-OUT TO REF LAB; TAT 18-24 HRS)    EKG None  Radiology Dg Chest Port 1 View  Result Date: 06/24/2019 CLINICAL DATA:  Cough and congestion for 10 days. Chest pain for 5 days. EXAM: PORTABLE CHEST 1 VIEW COMPARISON:  05/18/2018 chest radiograph and prior studies FINDINGS: Mild cardiomegaly again noted. There is no evidence of focal airspace disease, pulmonary edema, suspicious pulmonary nodule/mass, pleural effusion, or pneumothorax. No acute bony abnormalities are identified. IMPRESSION: Mild cardiomegaly without evidence of acute cardiopulmonary disease. Electronically Signed   By: Margarette Canada M.D.   On: 06/24/2019 20:47    Procedures Procedures (including critical care time)  Medications Ordered in ED Medications  amoxicillin-clavulanate (AUGMENTIN) 875-125 MG per tablet 1 tablet (1 tablet Oral Given 06/24/19 2011)  naproxen (NAPROSYN) tablet 500 mg (500 mg Oral Given 06/24/19 2011)  acetaminophen (TYLENOL) tablet 1,000 mg (1,000 mg Oral Given 06/24/19 2011)     Initial Impression / Assessment and Plan / ED Course  I have reviewed the triage vital signs and the nursing notes.  Pertinent labs & imaging results that were available during my care of the patient were reviewed by me and considered in my medical decision making (see chart for details).  33 year old female with history of recurrent sinusitis presents with 2 weeks of sinus symptoms worsening.  On exam she also has developed a right-sided ear infection.  Patient is having dental pain but I suspect this is referred pain from sinusitis.  She is having significant pain from sinus inflammation.  Given 1 dose of pain  medication here in the ED and will start patient on Augmentin.  Encouraged NSAIDs and Tylenol but if pain persist but is a sign that antibiotics are not helping and she would need to follow-up.  I have encouraged patient  to follow-up with ENT given recurrent sinus infections.  Her chest x-ray is clear today, aside from noted cardiomegaly without any evidence of pulmonary edema or vascular congestion, discussed this finding with patient will have her follow-up with PCP.  We will send COVID-19 test given some associated cough and persistent symptoms.  She expresses understanding and agreement with plan.  Discharged home in good condition.  Final Clinical Impressions(s) / ED Diagnoses   Final diagnoses:  Acute recurrent pansinusitis  Right otitis media, unspecified otitis media type  Cardiomegaly    ED Discharge Orders         Ordered    amoxicillin-clavulanate (AUGMENTIN) 875-125 MG tablet  2 times daily     06/24/19 2108           Jacqlyn Larsen, PA-C 06/29/19 1215    Gareth Morgan, MD 06/30/19 2059

## 2019-06-24 NOTE — ED Triage Notes (Signed)
Pt c/o sinus pressure x 2 weeks

## 2019-06-27 LAB — NOVEL CORONAVIRUS, NAA (HOSP ORDER, SEND-OUT TO REF LAB; TAT 18-24 HRS): SARS-CoV-2, NAA: NOT DETECTED
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
## 2019-12-02 ENCOUNTER — Emergency Department (HOSPITAL_BASED_OUTPATIENT_CLINIC_OR_DEPARTMENT_OTHER): Payer: Managed Care, Other (non HMO)

## 2019-12-02 ENCOUNTER — Other Ambulatory Visit: Payer: Self-pay

## 2019-12-02 ENCOUNTER — Emergency Department (HOSPITAL_BASED_OUTPATIENT_CLINIC_OR_DEPARTMENT_OTHER)
Admission: EM | Admit: 2019-12-02 | Discharge: 2019-12-03 | Disposition: A | Discharge status: Home / Self Care | Payer: Managed Care, Other (non HMO) | Attending: Emergency Medicine | Admitting: Emergency Medicine

## 2019-12-02 ENCOUNTER — Encounter (HOSPITAL_BASED_OUTPATIENT_CLINIC_OR_DEPARTMENT_OTHER): Payer: Self-pay | Admitting: *Deleted

## 2019-12-02 DIAGNOSIS — R202 Paresthesia of skin: Secondary | ICD-10-CM | POA: Diagnosis not present

## 2019-12-02 DIAGNOSIS — R109 Unspecified abdominal pain: Secondary | ICD-10-CM | POA: Diagnosis not present

## 2019-12-02 DIAGNOSIS — Z87891 Personal history of nicotine dependence: Secondary | ICD-10-CM | POA: Insufficient documentation

## 2019-12-02 DIAGNOSIS — M549 Dorsalgia, unspecified: Secondary | ICD-10-CM | POA: Diagnosis not present

## 2019-12-02 DIAGNOSIS — M25511 Pain in right shoulder: Secondary | ICD-10-CM | POA: Insufficient documentation

## 2019-12-02 DIAGNOSIS — R200 Anesthesia of skin: Secondary | ICD-10-CM | POA: Diagnosis not present

## 2019-12-02 DIAGNOSIS — N649 Disorder of breast, unspecified: Secondary | ICD-10-CM

## 2019-12-02 DIAGNOSIS — M542 Cervicalgia: Secondary | ICD-10-CM | POA: Insufficient documentation

## 2019-12-02 DIAGNOSIS — N6459 Other signs and symptoms in breast: Secondary | ICD-10-CM | POA: Insufficient documentation

## 2019-12-02 DIAGNOSIS — Z79899 Other long term (current) drug therapy: Secondary | ICD-10-CM | POA: Diagnosis not present

## 2019-12-02 DIAGNOSIS — R0789 Other chest pain: Secondary | ICD-10-CM | POA: Diagnosis not present

## 2019-12-02 LAB — COMPREHENSIVE METABOLIC PANEL
ALT: 26 U/L (ref 0–44)
AST: 31 U/L (ref 15–41)
Albumin: 4.7 g/dL (ref 3.5–5.0)
Alkaline Phosphatase: 58 U/L (ref 38–126)
Anion gap: 8 (ref 5–15)
BUN: 12 mg/dL (ref 6–20)
CO2: 23 mmol/L (ref 22–32)
Calcium: 9.6 mg/dL (ref 8.9–10.3)
Chloride: 105 mmol/L (ref 98–111)
Creatinine, Ser: 0.95 mg/dL (ref 0.44–1.00)
GFR calc Af Amer: >60 mL/min (ref >60)
GFR calc non Af Amer: >60 mL/min (ref >60)
Glucose, Bld: 92 mg/dL (ref 70–99)
Potassium: 3.8 mmol/L (ref 3.5–5.1)
Sodium: 136 mmol/L (ref 135–145)
Total Bilirubin: 1.2 mg/dL (ref 0.3–1.2)
Total Protein: 8 g/dL (ref 6.5–8.1)

## 2019-12-02 LAB — CBC WITH DIFFERENTIAL/PLATELET
Abs Immature Granulocytes: 0.02 10*3/uL (ref 0.00–0.07)
Basophils Absolute: 0.1 10*3/uL (ref 0.0–0.1)
Basophils Relative: 1 %
Eosinophils Absolute: 0.2 10*3/uL (ref 0.0–0.5)
Eosinophils Relative: 2 %
HCT: 45.1 % (ref 36.0–46.0)
Hemoglobin: 14.4 g/dL (ref 12.0–15.0)
Immature Granulocytes: 0 %
Lymphocytes Relative: 24 %
Lymphs Abs: 2.2 10*3/uL (ref 0.7–4.0)
MCH: 31.6 pg (ref 26.0–34.0)
MCHC: 31.9 g/dL (ref 30.0–36.0)
MCV: 99.1 fL (ref 80.0–100.0)
Monocytes Absolute: 0.5 10*3/uL (ref 0.1–1.0)
Monocytes Relative: 6 %
Neutro Abs: 6.1 10*3/uL (ref 1.7–7.7)
Neutrophils Relative %: 67 %
Platelets: 276 10*3/uL (ref 150–400)
RBC: 4.55 MIL/uL (ref 3.87–5.11)
RDW: 12 % (ref 11.5–15.5)
WBC: 9.1 10*3/uL (ref 4.0–10.5)
nRBC: 0 % (ref 0.0–0.2)

## 2019-12-02 LAB — PREGNANCY, URINE: Preg Test, Ur: NEGATIVE

## 2019-12-02 MED ORDER — IOHEXOL 300 MG/ML  SOLN
100.0000 mL | Freq: Once | INTRAMUSCULAR | Status: AC | PRN
  Administered 2019-12-02: 100 mL via INTRAVENOUS

## 2019-12-02 MED ORDER — DICLOFENAC SODIUM ER 100 MG PO TB24: 100.0000 mg | ORAL_TABLET | Freq: Every day | ORAL | 0 refills | Status: DC
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
## 2019-12-02 NOTE — ED Notes (Signed)
Imaging after u preg results 
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
## 2019-12-03 ENCOUNTER — Emergency Department (HOSPITAL_BASED_OUTPATIENT_CLINIC_OR_DEPARTMENT_OTHER): Payer: Managed Care, Other (non HMO)

## 2019-12-03 MED ORDER — IBUPROFEN 800 MG PO TABS
800.0000 mg | ORAL_TABLET | Freq: Once | ORAL | Status: AC
  Administered 2019-12-03: 800 mg via ORAL
  Filled 2019-12-03: qty 1

## 2019-12-03 MED ORDER — METHOCARBAMOL 500 MG PO TABS
500.0000 mg | ORAL_TABLET | ORAL | Status: AC
  Administered 2019-12-03: 500 mg via ORAL
  Filled 2019-12-03: qty 1

## 2019-12-03 MED ORDER — METAXALONE 400 MG PO TABS: 400.0000 mg | ORAL_TABLET | Freq: Three times a day (TID) | ORAL | 0 refills | Status: DC

## 2019-12-03 NOTE — ED Notes (Signed)
Cervical collar removed per provider order.

## 2020-01-03 ENCOUNTER — Emergency Department (HOSPITAL_BASED_OUTPATIENT_CLINIC_OR_DEPARTMENT_OTHER): Payer: Managed Care, Other (non HMO)

## 2020-01-03 ENCOUNTER — Other Ambulatory Visit: Payer: Self-pay

## 2020-01-03 ENCOUNTER — Encounter (HOSPITAL_BASED_OUTPATIENT_CLINIC_OR_DEPARTMENT_OTHER): Payer: Self-pay | Admitting: Emergency Medicine

## 2020-01-03 ENCOUNTER — Emergency Department (HOSPITAL_BASED_OUTPATIENT_CLINIC_OR_DEPARTMENT_OTHER)
Admission: EM | Admit: 2020-01-03 | Discharge: 2020-01-03 | Disposition: A | Discharge status: Home / Self Care | Payer: Managed Care, Other (non HMO) | Attending: Emergency Medicine | Admitting: Emergency Medicine

## 2020-01-03 DIAGNOSIS — Z87891 Personal history of nicotine dependence: Secondary | ICD-10-CM | POA: Insufficient documentation

## 2020-01-03 DIAGNOSIS — N939 Abnormal uterine and vaginal bleeding, unspecified: Secondary | ICD-10-CM

## 2020-01-03 DIAGNOSIS — R42 Dizziness and giddiness: Secondary | ICD-10-CM | POA: Insufficient documentation

## 2020-01-03 DIAGNOSIS — R58 Hemorrhage, not elsewhere classified: Secondary | ICD-10-CM

## 2020-01-03 LAB — ABO/RH: ABO/RH(D): A POS

## 2020-01-03 LAB — CBC
HCT: 44.4 % (ref 36.0–46.0)
Hemoglobin: 14.2 g/dL (ref 12.0–15.0)
MCH: 32.4 pg (ref 26.0–34.0)
MCHC: 32 g/dL (ref 30.0–36.0)
MCV: 101.4 fL — ABNORMAL HIGH (ref 80.0–100.0)
Platelets: 309 10*3/uL (ref 150–400)
RBC: 4.38 MIL/uL (ref 3.87–5.11)
RDW: 11.4 % — ABNORMAL LOW (ref 11.5–15.5)
WBC: 9 10*3/uL (ref 4.0–10.5)
nRBC: 0 % (ref 0.0–0.2)

## 2020-01-03 LAB — URINALYSIS, ROUTINE W REFLEX MICROSCOPIC
Glucose, UA: NEGATIVE mg/dL
Ketones, ur: NEGATIVE mg/dL
Leukocytes,Ua: NEGATIVE
Nitrite: NEGATIVE
Protein, ur: 30 mg/dL — AB
Specific Gravity, Urine: >1.03 — ABNORMAL HIGH (ref 1.005–1.030)
pH: 5.5 (ref 5.0–8.0)

## 2020-01-03 LAB — URINALYSIS, MICROSCOPIC (REFLEX)
RBC / HPF: >50 RBC/hpf (ref 0–5)
WBC, UA: NONE SEEN WBC/hpf (ref 0–5)

## 2020-01-03 LAB — COMPREHENSIVE METABOLIC PANEL
ALT: 19 U/L (ref 0–44)
AST: 17 U/L (ref 15–41)
Albumin: 4.4 g/dL (ref 3.5–5.0)
Alkaline Phosphatase: 61 U/L (ref 38–126)
Anion gap: 10 (ref 5–15)
BUN: 10 mg/dL (ref 6–20)
CO2: 25 mmol/L (ref 22–32)
Calcium: 9.2 mg/dL (ref 8.9–10.3)
Chloride: 102 mmol/L (ref 98–111)
Creatinine, Ser: 1.04 mg/dL — ABNORMAL HIGH (ref 0.44–1.00)
GFR calc Af Amer: >60 mL/min (ref >60)
GFR calc non Af Amer: >60 mL/min (ref >60)
Glucose, Bld: 93 mg/dL (ref 70–99)
Potassium: 3.5 mmol/L (ref 3.5–5.1)
Sodium: 137 mmol/L (ref 135–145)
Total Bilirubin: 1 mg/dL (ref 0.3–1.2)
Total Protein: 7.9 g/dL (ref 6.5–8.1)

## 2020-01-03 LAB — PREGNANCY, URINE: Preg Test, Ur: NEGATIVE

## 2020-01-03 LAB — HCG, QUANTITATIVE, PREGNANCY: hCG, Beta Chain, Quant, S: <1 m[IU]/mL (ref <5)

## 2020-01-03 MED ORDER — SODIUM CHLORIDE 0.9 % IV BOLUS
1000.0000 mL | Freq: Once | INTRAVENOUS | Status: AC
  Administered 2020-01-03: 1000 mL via INTRAVENOUS

## 2020-01-03 NOTE — ED Provider Notes (Signed)
[facility] Emergency Department Provider Note MRN:  [id number]  Arrival date & time: 01/03/20     Chief Complaint   Vaginal Bleeding   History of Present Illness   Yvonne [name] is a 34 y.o. year-old female with a history of fibroids presenting to the ED with chief complaint of vaginal bleeding.  Patient's last menstrual period was January 22, has had occasional spotting since that time.  Had a positive pregnancy test a few days ago.  Began having heavy vaginal bleeding this morning, passing clots, heavy bleeding similar to her heavy periods.  Feeling lightheaded this morning as well.  Denies abdominal pain or pelvic pain.  No chest pain or shortness of breath, no fever, no other complaints.  Review of Systems  A complete 10 system review of systems was obtained and all systems are negative except as noted in the HPI and PMH.   Patient's Health History    Past Medical History:  Diagnosis Date  . Anemia   . Fibroids   . Obesity, Class III, BMI 40-49.9 (morbid obesity) (HCC)    BMI 40  . Recurrent UTI (urinary tract infection)   . Shortness of breath    "sometimes when I have a cold"  . Spasm of lung air passages     Past Surgical History:  Procedure Laterality Date  . left arm surgey    . ROBOT ASSISTED MYOMECTOMY  07/12/2012   Procedure: ROBOTIC ASSISTED MYOMECTOMY;  Surgeon: Lavonia Drafts, MD;  Location: [facility];  Service: Gynecology;  Laterality: N/A;  . THERAPEUTIC ABORTION      Family History  Problem Relation Age of Onset  . Fibroids Mother   . Hypertension Mother     Social History   Socioeconomic History  . Marital status: Married    Spouse name: Not on file  . Number of children: Not on file  . Years of education: Not on file  . Highest education level: Not on file  Occupational History  . Not on file  Tobacco Use  . Smoking status: Former Smoker    Packs/day: 0.25    Types: Cigarettes    Quit date: 04/26/2019   Years since quitting: 0.6  . Smokeless tobacco: Never Used  Substance and Sexual Activity  . Alcohol use: Not Currently    Comment: occasionally  . Drug use: Not Currently    Types: Marijuana    Comment: patient states, "once in a while per month"  . Sexual activity: Yes    Birth control/protection: None, Pill  Other Topics Concern  . Not on file  Social History Narrative  . Not on file   Social Determinants of Health   Financial Resource Strain:   . Difficulty of Paying Living Expenses:   Food Insecurity:   . Worried About Charity fundraiser in the Last Year:   . Arboriculturist in the Last Year:   Transportation Needs:   . Film/video editor (Medical):   Marland Kitchen Lack of Transportation (Non-Medical):   Physical Activity:   . Days of Exercise per Week:   . Minutes of Exercise per Session:   Stress:   . Feeling of Stress :   Social Connections:   . Frequency of Communication with Friends and Family:   . Frequency of Social Gatherings with Friends and Family:   . Attends Religious Services:   . Active Member of Clubs or Organizations:   . Attends Archivist Meetings:   .  Marital Status:   Intimate Partner Violence:   . Fear of Current or Ex-Partner:   . Emotionally Abused:   Marland Kitchen Physically Abused:   . Sexually Abused:      Physical Exam   Vitals:   01/03/20 0815 01/03/20 1113  BP: (!) 125/94 127/68  Pulse: 97 61  Resp: 16 14  Temp: 99.1 F (37.3 C)   SpO2: 97% 100%    CONSTITUTIONAL: Well-appearing, NAD NEURO:  Alert and oriented x 3, no focal deficits EYES:  eyes equal and reactive ENT/NECK:  no LAD, no JVD CARDIO: Regular rate, well-perfused, normal S1 and S2 PULM:  CTAB no wheezing or rhonchi GI/GU:  normal bowel sounds, non-distended, non-tender MSK/SPINE:  No gross deformities, no edema SKIN:  no rash, atraumatic PSYCH:  Appropriate speech and behavior  *Additional and/or pertinent findings included in MDM below  Diagnostic and  Interventional Summary    EKG Interpretation  Date/Time:    Ventricular Rate:    PR Interval:    QRS Duration:   QT Interval:    QTC Calculation:   R Axis:     Text Interpretation:        Labs Reviewed  URINALYSIS, ROUTINE W REFLEX MICROSCOPIC - Abnormal; Notable for the following components:      Result Value   APPearance CLOUDY (*)    Specific Gravity, Urine >1.030 (*)    Hgb urine dipstick LARGE (*)    Bilirubin Urine SMALL (*)    Protein, ur 30 (*)    All other components within normal limits  CBC - Abnormal; Notable for the following components:   MCV 101.4 (*)    RDW 11.4 (*)    All other components within normal limits  COMPREHENSIVE METABOLIC PANEL - Abnormal; Notable for the following components:   Creatinine, Ser 1.04 (*)    All other components within normal limits  URINALYSIS, MICROSCOPIC (REFLEX) - Abnormal; Notable for the following components:   Bacteria, UA FEW (*)    All other components within normal limits  PREGNANCY, URINE  HCG, QUANTITATIVE, PREGNANCY  ABO/RH    US PELVIC COMPLETE W TRANSVAGINAL AND TORSION R/O  Final Result      Medications  sodium chloride 0.9 % bolus 1,000 mL (0 mLs Intravenous Stopped 01/03/20 1000)     Procedures  /  Critical Care Procedures  ED Course and Medical Decision Making  I have reviewed the triage vital signs, the nursing notes, and pertinent available records from the EMR.  Pertinent labs & imaging results that were available during my care of the patient were reviewed by me and considered in my medical decision making (see below for details).     Will need ultrasound to rule out ectopic pregnancy, patient is estimated 8 weeks and 4 days by dates.  hCG is negative, ultrasound still obtained to exclude retained products or missed abortion, no signs of such conditions.  Symptoms seem to be due to her period which is a bit late, having some mild discomfort that could be explained by fibroids and/or hemorrhagic  cyst.  No discharge, no concerns for STDs, no concerns for PID.  H&H reassuring, appropriate for discharge with GYN follow-up.  Barth Kirks. Sedonia Small, MD [place] [email or web address]  Final Clinical Impressions(s) / ED Diagnoses     ICD-10-CM   1. Vaginal bleeding  N93.9   2. Bleeding  R58 US PELVIC COMPLETE W TRANSVAGINAL AND TORSION R/O    US PELVIC COMPLETE W  TRANSVAGINAL AND TORSION R/O    ED Discharge Orders    None       Discharge Instructions Discussed with and Provided to Patient:     Discharge Instructions     You were evaluated in the Emergency Department and after careful evaluation, we did not find any emergent condition requiring admission or further testing in the hospital.  Your exam/testing today was overall reassuring.  Your symptoms seem to be related to fibroids and possibly due to a cyst on the ovary.  We recommend follow-up with the GYN doctors within the next few weeks.  We recommend a repeat ultrasound within the next 6 to 12 weeks.  This can be scheduled by her GYN doctor.  Please return to the Emergency Department if you experience any worsening of your condition.  We encourage you to follow up with a primary care provider.  Thank you for allowing [place] to be a part of your care.        Maudie Flakes, MD 01/03/20 1147

## 2020-01-03 NOTE — ED Notes (Signed)
Patient transported to Ultrasound 

## 2020-01-03 NOTE — Discharge Instructions (Addendum)
You were evaluated in the Emergency Department and after careful evaluation, we did not find any emergent condition requiring admission or further testing in the hospital.  Your exam/testing today was overall reassuring.  Your symptoms seem to be related to fibroids and possibly due to a cyst on the ovary.  We recommend follow-up with the GYN doctors within the next few weeks.  We recommend a repeat ultrasound within the next 6 to 12 weeks.  This can be scheduled by her GYN doctor.  Please return to the Emergency Department if you experience any worsening of your condition.  We encourage you to follow up with a primary care provider.  Thank you for allowing [place] to be a part of your care.

## 2020-01-03 NOTE — ED Triage Notes (Signed)
Pt states positive home preg test on Sunday. Pt c/o vaginal bleeding that pt reports started last night. Pt states bleeding started last night as "spotting, then passing heavy clots this am. Pt also c/o lower abdominal cramping. Pt also endorses feeling dizzy.

## 2020-01-03 NOTE — ED Notes (Signed)
Pt ambulatory with steady gait to restroom for urine specimen 

## 2020-07-13 DIAGNOSIS — I471 Supraventricular tachycardia, unspecified: Secondary | ICD-10-CM

## 2020-07-13 HISTORY — DX: Supraventricular tachycardia, unspecified: I47.10

## 2020-07-13 HISTORY — DX: Supraventricular tachycardia: I47.1

## 2021-05-06 ENCOUNTER — Other Ambulatory Visit: Payer: Self-pay | Admitting: Obstetrics and Gynecology

## 2021-05-06 DIAGNOSIS — N8003 Adenomyosis of the uterus: Secondary | ICD-10-CM

## 2021-05-06 DIAGNOSIS — N800 Endometriosis of uterus: Secondary | ICD-10-CM

## 2021-05-07 ENCOUNTER — Other Ambulatory Visit: Payer: Self-pay

## 2021-05-07 ENCOUNTER — Ambulatory Visit
Admission: RE | Admit: 2021-05-07 | Discharge: 2021-05-07 | Disposition: A | Discharge status: Home / Self Care | Payer: Managed Care, Other (non HMO) | Source: Ambulatory Visit | Attending: Obstetrics and Gynecology | Admitting: Obstetrics and Gynecology

## 2021-05-07 DIAGNOSIS — N8003 Adenomyosis of the uterus: Secondary | ICD-10-CM

## 2021-05-07 DIAGNOSIS — N800 Endometriosis of uterus: Secondary | ICD-10-CM

## 2021-05-07 MED ORDER — GADOBENATE DIMEGLUMINE 529 MG/ML IV SOLN
20.0000 mL | Freq: Once | INTRAVENOUS | Status: AC | PRN
  Administered 2021-05-07: 20 mL via INTRAVENOUS

## 2021-06-24 ENCOUNTER — Telehealth: Payer: Self-pay | Admitting: Hematology and Oncology

## 2021-06-24 NOTE — Telephone Encounter (Signed)
Pt had called in requesting to r/s her New Hem appt. When I spoke to pt she needed her appt early in the morning on Thursday. I r/s her appt to Dr. [name] schedule, pt is aware of new appt date and time.

## 2021-06-26 ENCOUNTER — Encounter: Payer: Managed Care, Other (non HMO) | Admitting: Hematology

## 2021-07-04 ENCOUNTER — Other Ambulatory Visit: Payer: Self-pay

## 2021-07-04 ENCOUNTER — Inpatient Hospital Stay: Payer: Managed Care, Other (non HMO)

## 2021-07-04 ENCOUNTER — Inpatient Hospital Stay: Payer: Managed Care, Other (non HMO) | Attending: Hematology | Admitting: Hematology and Oncology

## 2021-07-04 VITALS — BP 132/72 | HR 65 | Temp 98.1°F | Resp 17 | Ht 68.0 in | Wt 324.8 lb

## 2021-07-04 DIAGNOSIS — D500 Iron deficiency anemia secondary to blood loss (chronic): Secondary | ICD-10-CM | POA: Insufficient documentation

## 2021-07-04 DIAGNOSIS — Z87891 Personal history of nicotine dependence: Secondary | ICD-10-CM | POA: Insufficient documentation

## 2021-07-04 DIAGNOSIS — N920 Excessive and frequent menstruation with regular cycle: Secondary | ICD-10-CM | POA: Diagnosis not present

## 2021-07-04 LAB — CMP (CANCER CENTER ONLY)
ALT: 13 U/L (ref 0–44)
AST: 14 U/L — ABNORMAL LOW (ref 15–41)
Albumin: 3.7 g/dL (ref 3.5–5.0)
Alkaline Phosphatase: 62 U/L (ref 38–126)
Anion gap: 7 (ref 5–15)
BUN: 11 mg/dL (ref 6–20)
CO2: 23 mmol/L (ref 22–32)
Calcium: 9 mg/dL (ref 8.9–10.3)
Chloride: 107 mmol/L (ref 98–111)
Creatinine: 0.81 mg/dL (ref 0.44–1.00)
GFR, Estimated: >60 mL/min (ref >60)
Glucose, Bld: 96 mg/dL (ref 70–99)
Potassium: 4 mmol/L (ref 3.5–5.1)
Sodium: 137 mmol/L (ref 135–145)
Total Bilirubin: 0.5 mg/dL (ref 0.3–1.2)
Total Protein: 6.8 g/dL (ref 6.5–8.1)

## 2021-07-04 LAB — CBC WITH DIFFERENTIAL (CANCER CENTER ONLY)
Abs Immature Granulocytes: 0.06 10*3/uL (ref 0.00–0.07)
Basophils Absolute: 0.1 10*3/uL (ref 0.0–0.1)
Basophils Relative: 1 %
Eosinophils Absolute: 0.2 10*3/uL (ref 0.0–0.5)
Eosinophils Relative: 2 %
HCT: 27.8 % — ABNORMAL LOW (ref 36.0–46.0)
Hemoglobin: 7.5 g/dL — ABNORMAL LOW (ref 12.0–15.0)
Immature Granulocytes: 1 %
Lymphocytes Relative: 33 %
Lymphs Abs: 2.9 10*3/uL (ref 0.7–4.0)
MCH: 19.9 pg — ABNORMAL LOW (ref 26.0–34.0)
MCHC: 27 g/dL — ABNORMAL LOW (ref 30.0–36.0)
MCV: 73.7 fL — ABNORMAL LOW (ref 80.0–100.0)
Monocytes Absolute: 0.5 10*3/uL (ref 0.1–1.0)
Monocytes Relative: 6 %
Neutro Abs: 4.9 10*3/uL (ref 1.7–7.7)
Neutrophils Relative %: 57 %
Platelet Count: 429 10*3/uL — ABNORMAL HIGH (ref 150–400)
RBC: 3.77 MIL/uL — ABNORMAL LOW (ref 3.87–5.11)
RDW: 19.3 % — ABNORMAL HIGH (ref 11.5–15.5)
WBC Count: 8.6 10*3/uL (ref 4.0–10.5)
nRBC: 0 % (ref 0.0–0.2)

## 2021-07-04 LAB — RETIC PANEL
Immature Retic Fract: 33.5 % — ABNORMAL HIGH (ref 2.3–15.9)
RBC.: 3.77 MIL/uL — ABNORMAL LOW (ref 3.87–5.11)
Retic Count, Absolute: 112 10*3/uL (ref 19.0–186.0)
Retic Ct Pct: 3 % (ref 0.4–3.1)
Reticulocyte Hemoglobin: 20.1 pg — ABNORMAL LOW (ref >27.9)

## 2021-07-04 LAB — IRON AND TIBC
Iron: 10 ug/dL — ABNORMAL LOW (ref 41–142)
Saturation Ratios: 2 % — ABNORMAL LOW (ref 21–57)
TIBC: 430 ug/dL (ref 236–444)
UIBC: 420 ug/dL — ABNORMAL HIGH (ref 120–384)

## 2021-07-04 LAB — FERRITIN: Ferritin: 20 ng/mL (ref 11–307)

## 2021-07-04 LAB — SAMPLE TO BLOOD BANK

## 2021-07-04 NOTE — Progress Notes (Signed)
[facility] [phone]   [phone] [phone] NOTE  Patient Care Team: [facility]. as PCP - General Irene Limbo, Cloria Spring, MD as Consulting Physician (Hematology)  Hematological/Oncological History # Iron Deficiency Anemia 2/2 to GYN Bleeding 01/03/2020: WBC 9.0, Hgb 14.2, MCV 101.4, Plt 309 06/12/2021: WBC 8.9, Hgb 7.8, MCV 73, Plt 416. Ferritin 9  07/04/2021: establish care with Dr. [name]   CHIEF COMPLAINTS/PURPOSE OF CONSULTATION:  "Iron Deficiency Anemia "  HISTORY OF PRESENTING ILLNESS:  Yvonne [name] 35 y.o. female with medical history significant for iron deficiency anemia 2/2 to GYN bleeding.   On review of the previous records Mrs. Angelucci last had a normal CBC on 01/03/2020 with a WBC 9.0, Hgb 14.2, MCV 101.4, Plt 309. Last CBC on 06/12/2021 showed a Hgb 7.8 with MCV 73 and ferritin of 9. This is thought to be secondary to a bleeding fibroid. Due to concern for this iron deficiency anemia the patient as referred to hematology for further evaluation and management.   On exam today Mrs. Ramsaran reports that she has issues with anemia, thyroid, and fibroids.  She notes that he has had anemia "all her life".  She notes that she had fibroids for at least 9 years and did have surgical resection or 5-6 removed.  She notes that 12 of these have recurred.  She has remarkably heavy periods within the periods last from 7 to 15 days.  She reports that she typically uses to 12-hour pads and changes these every 30 minutes as they are saturated.  She also passes clots.  She notes no other overt signs of bleeding elsewhere.  She notes that her gynecologist has "tried everything".  Including oral birth control.  She has never had any children and is hesitant to proceed with a hysterectomy.  Of note she was seen in our clinic by Dr. Irene Limbo previously and has received IV iron before but has not for several years.  She notes that she is currently  taking iron pills twice daily but that it causes constipation does not improve her symptoms.  Her family history is remarkable for hypertension vitamin B12 deficiency in her mother and cervical cancer in a paternal aunt.  She is a brother with hypertension as well as a paternal aunt with breast cancer.  She notes that she "always wants a steak".  Does have ice cravings.  She stopped smoking about 3 weeks ago but does smoke marijuana daily.  She does drink a day glass of wine on occasion.  She does not was having night sweats, dizziness, lightheadedness, but denies any fevers, chills, chest pain, vomiting, diarrhea.  A full 10 point ROS is listed below.  MEDICAL HISTORY:  Past Medical History:  Diagnosis Date   Anemia    Fibroids    Obesity, Class III, BMI 40-49.9 (morbid obesity) (HCC)    BMI 40   Recurrent UTI (urinary tract infection)    Shortness of breath    "sometimes when I have a cold"   Spasm of lung air passages     SURGICAL HISTORY: Past Surgical History:  Procedure Laterality Date   left arm surgey     ROBOT ASSISTED MYOMECTOMY  07/12/2012   Procedure: ROBOTIC ASSISTED MYOMECTOMY;  Surgeon: Lavonia Drafts, MD;  Location: [facility];  Service: Gynecology;  Laterality: N/A;   THERAPEUTIC ABORTION      SOCIAL HISTORY: Social History   Socioeconomic History   Marital status: Married  Spouse name: Not on file   Number of children: Not on file   Years of education: Not on file   Highest education level: Not on file  Occupational History   Not on file  Tobacco Use   Smoking status: Former    Packs/day: 0.25    Types: Cigarettes    Quit date: 04/26/2019    Years since quitting: 2.1   Smokeless tobacco: Never  Vaping Use   Vaping Use: Never used  Substance and Sexual Activity   Alcohol use: Not Currently    Comment: occasionally   Drug use: Not Currently    Types: Marijuana    Comment: patient states, "once in a while per month"   Sexual activity: Yes    Birth  control/protection: None, Pill  Other Topics Concern   Not on file  Social History Narrative   Not on file   Social Determinants of Health   Financial Resource Strain: Not on file  Food Insecurity: Not on file  Transportation Needs: Not on file  Physical Activity: Not on file  Stress: Not on file  Social Connections: Not on file  Intimate Partner Violence: Not on file    FAMILY HISTORY: Family History  Problem Relation Age of Onset   Fibroids Mother    Hypertension Mother     ALLERGIES:  is allergic to blue dyes (parenteral).  MEDICATIONS:  Current Outpatient Medications  Medication Sig Dispense Refill   albuterol (VENTOLIN HFA) 108 (90 Base) MCG/ACT inhaler 1 puff as needed     azelastine (ASTELIN) 0.1 % nasal spray      ferrous sulfate 325 (65 FE) MG tablet Take 650 mg by mouth daily with breakfast.     magnesium oxide (MAG-OX) 400 MG tablet Take by mouth.     metoprolol tartrate (LOPRESSOR) 25 MG tablet Take by mouth.     Misc. Devices MISC Initiate CPAP therapy at 9 cm. water pressure for OSA.  ResMed AirFit P10 (S) nasal pillow mask or patient preference.  Send to [facility] in [place].     traMADol (ULTRAM) 50 MG tablet Take by mouth every 6 (six) hours as needed.     traZODone (DESYREL) 50 MG tablet 1 tablet at bedtime as needed     cetirizine (ZYRTEC) 10 MG tablet Take 10 mg by mouth daily as needed for allergies.     cholecalciferol (VITAMIN D3) 25 MCG (1000 UNIT) tablet 1 tablet     fluticasone (FLONASE) 50 MCG/ACT nasal spray Place 2 sprays into both nostrils daily as needed for allergies or rhinitis.     SYNTHROID 175 MCG tablet Take 175 mcg by mouth daily.     valACYclovir (VALTREX) 500 MG tablet Take 500 mg by mouth daily as needed (outbreaks).     No current facility-administered medications for this visit.    REVIEW OF SYSTEMS:   Constitutional: ( - ) fevers, ( - )  chills , ( - ) night sweats Eyes: ( - ) blurriness of vision, ( - ) double vision, ( -  ) watery eyes Ears, nose, mouth, throat, and face: ( - ) mucositis, ( - ) sore throat Respiratory: ( - ) cough, ( - ) dyspnea, ( - ) wheezes Cardiovascular: ( - ) palpitation, ( - ) chest discomfort, ( - ) lower extremity swelling Gastrointestinal:  ( - ) nausea, ( - ) heartburn, ( - ) change in bowel habits Skin: ( - ) abnormal skin rashes Lymphatics: ( - ) new lymphadenopathy, ( - )  easy bruising Neurological: ( - ) numbness, ( - ) tingling, ( - ) new weaknesses Behavioral/Psych: ( - ) mood change, ( - ) new changes  All other systems were reviewed with the patient and are negative.  PHYSICAL EXAMINATION: ECOG PERFORMANCE STATUS: 1 - Symptomatic but completely ambulatory  Vitals:   07/04/21 0912  BP: 132/72  Pulse: 65  Resp: 17  Temp: 98.1 F (36.7 C)  SpO2: 98%   Filed Weights   07/04/21 0912  Weight: (!) 324 lb 12.8 oz (147.3 kg)    GENERAL: well appearing middle-aged African-American female in NAD  SKIN: skin color, texture, turgor are normal, no rashes or significant lesions EYES: conjunctiva are pink and non-injected, sclera clear LUNGS: clear to auscultation and percussion with normal breathing effort HEART: regular rate & rhythm and no murmurs and no lower extremity edema Musculoskeletal: no cyanosis of digits and no clubbing  PSYCH: alert & oriented x 3, fluent speech NEURO: no focal motor/sensory deficits  LABORATORY DATA:  I have reviewed the data as listed CBC Latest Ref Rng & Units 07/04/2021 01/03/2020 12/02/2019  WBC 4.0 - 10.5 K/uL 8.6 9.0 9.1  Hemoglobin 12.0 - 15.0 g/dL 7.5(L) 14.2 14.4  Hematocrit 36.0 - 46.0 % 27.8(L) 44.4 45.1  Platelets 150 - 400 K/uL 429(H) 309 276    CMP Latest Ref Rng & Units 07/04/2021 01/03/2020 12/02/2019  Glucose 70 - 99 mg/dL 96 93 92  BUN 6 - 20 mg/dL 11 10 12   Creatinine 0.44 - 1.00 mg/dL 0.81 1.04(H) 0.95  Sodium 135 - 145 mmol/L 137 137 136  Potassium 3.5 - 5.1 mmol/L 4.0 3.5 3.8  Chloride 98 - 111 mmol/L 107 102 105   CO2 22 - 32 mmol/L 23 25 23   Calcium 8.9 - 10.3 mg/dL 9.0 9.2 9.6  Total Protein 6.5 - 8.1 g/dL 6.8 7.9 8.0  Total Bilirubin 0.3 - 1.2 mg/dL 0.5 1.0 1.2  Alkaline Phos 38 - 126 U/L 62 61 58  AST 15 - 41 U/L 14(L) 17 31  ALT 0 - 44 U/L 13 19 26     RADIOGRAPHIC STUDIES: No results found.  ASSESSMENT & PLAN Yvonne [name] 35 y.o. female with medical history significant for iron deficiency anemia 2/2 to GYN bleeding.   After review of the labs, review of the records, and discussion with the patient the patients findings are most consistent with iron deficiency anemia secondary to heavy GYN bleeding from her fibroids.  At this time treatment of iron deficiency anemia is a two-pronged attack.  First be to replete her depleted iron stores.  She is trialed on p.o. iron therapy but given her heavy menstrual cycles I do not believe these are adequate to replete her stores.  As such I recommend we proceed with IV iron sucrose 200 mg q. 7 days x 5 doses.  The second half of this would be to stop the bleeding which is currently being managed by OB/GYN.  The patient notes that she is so symptomatic she would be willing to undergo hysterectomy despite the fact she has not had children before.  Would encourage her to continue discussions with OB/GYN to see what surgical nonsurgical options are available to her.  We will plan to see her back in approximately 2 to 4 weeks after her last dose of IV iron.  # Iron Deficiency Anemia 2/2 to GYN Bleeding -- Continue ferrous sulfate 325 mg p.o. daily with a source of vitamin C --Due to severe anemia and  heavy iron deficiency anemia we will proceed with IV iron sucrose 200 mg q. 7 days x 5 doses --Continue to follow with OB/GYN --Plan to see the patient back in 2 to 4 weeks time after last dose of IV iron.  Orders Placed This Encounter  Procedures   CBC with Differential ([facility] Only)    Standing Status:   Future    Number of Occurrences:   1    Standing  Expiration Date:   07/04/2022   CMP ([facility] only)    Standing Status:   Future    Number of Occurrences:   1    Standing Expiration Date:   07/04/2022   Ferritin    Standing Status:   Future    Number of Occurrences:   1    Standing Expiration Date:   07/04/2022   Iron and TIBC    Standing Status:   Future    Number of Occurrences:   1    Standing Expiration Date:   07/04/2022   Retic Panel    Standing Status:   Future    Number of Occurrences:   1    Standing Expiration Date:   07/04/2022   Sample to Blood Bank    Standing Status:   Future    Number of Occurrences:   1    Standing Expiration Date:   07/04/2022    All questions were answered. The patient knows to call the clinic with any problems, questions or concerns.  A total of more than 60 minutes were spent on this encounter with face-to-face time and non-face-to-face time, including preparing to see the patient, ordering tests and/or medications, counseling the patient and coordination of care as outlined above.   Ledell Peoples, MD Department of Hematology/Oncology [place] [facility] Phone: [phone] Pager: [phone] Email: [email or web address]  07/04/2021 12:48 PM

## 2021-07-10 ENCOUNTER — Telehealth: Payer: Self-pay | Admitting: Hematology and Oncology

## 2021-07-10 NOTE — Telephone Encounter (Signed)
Scheduled per 09/22 los, patient has been called and voicemail was left. 

## 2021-07-10 NOTE — Telephone Encounter (Signed)
Rescheduled upcoming appointments per patient's request, patient will be notified per my chart.

## 2021-07-11 ENCOUNTER — Telehealth: Payer: Self-pay | Admitting: Hematology and Oncology

## 2021-07-11 NOTE — Telephone Encounter (Signed)
Scheduled appointment per melanie. Patient is aware.  

## 2021-07-15 ENCOUNTER — Other Ambulatory Visit: Payer: Self-pay

## 2021-07-15 ENCOUNTER — Inpatient Hospital Stay: Payer: Managed Care, Other (non HMO) | Attending: Hematology

## 2021-07-15 ENCOUNTER — Ambulatory Visit: Payer: Managed Care, Other (non HMO)

## 2021-07-15 VITALS — BP 103/76 | HR 78 | Temp 98.9°F | Resp 17 | Ht 68.0 in | Wt 321.8 lb

## 2021-07-15 DIAGNOSIS — N920 Excessive and frequent menstruation with regular cycle: Secondary | ICD-10-CM | POA: Diagnosis present

## 2021-07-15 DIAGNOSIS — D500 Iron deficiency anemia secondary to blood loss (chronic): Secondary | ICD-10-CM | POA: Insufficient documentation

## 2021-07-15 MED ORDER — SODIUM CHLORIDE 0.9 % IV SOLN
200.0000 mg | Freq: Once | INTRAVENOUS | Status: AC
  Administered 2021-07-15: 200 mg via INTRAVENOUS
  Filled 2021-07-15: qty 200

## 2021-07-15 MED ORDER — SODIUM CHLORIDE 0.9 % IV SOLN: Freq: Once | INTRAVENOUS | Status: AC

## 2021-07-15 NOTE — Patient Instructions (Signed)

## 2021-07-22 ENCOUNTER — Inpatient Hospital Stay: Payer: Managed Care, Other (non HMO)

## 2021-07-22 ENCOUNTER — Other Ambulatory Visit: Payer: Self-pay

## 2021-07-22 ENCOUNTER — Ambulatory Visit: Payer: Managed Care, Other (non HMO)

## 2021-07-22 VITALS — BP 118/73 | HR 75 | Temp 98.6°F | Resp 18

## 2021-07-22 DIAGNOSIS — D500 Iron deficiency anemia secondary to blood loss (chronic): Secondary | ICD-10-CM | POA: Diagnosis not present

## 2021-07-22 MED ORDER — SODIUM CHLORIDE 0.9 % IV SOLN: Freq: Once | INTRAVENOUS | Status: AC

## 2021-07-22 MED ORDER — SODIUM CHLORIDE 0.9 % IV SOLN
200.0000 mg | Freq: Once | INTRAVENOUS | Status: AC
  Administered 2021-07-22: 200 mg via INTRAVENOUS
  Filled 2021-07-22: qty 200

## 2021-07-22 NOTE — Patient Instructions (Signed)

## 2021-07-25 ENCOUNTER — Telehealth: Payer: Self-pay

## 2021-07-25 NOTE — Telephone Encounter (Signed)
Notified Patient of completion of FMLA Forms. Fax Transmission Confirmation received. Copy emailed to patient and placed at Registration Desk for pick up as requested

## 2021-07-29 ENCOUNTER — Inpatient Hospital Stay: Payer: Managed Care, Other (non HMO)

## 2021-07-29 ENCOUNTER — Other Ambulatory Visit: Payer: Self-pay

## 2021-07-29 ENCOUNTER — Ambulatory Visit: Payer: Managed Care, Other (non HMO)

## 2021-07-29 VITALS — BP 120/73 | HR 60 | Temp 98.8°F | Resp 18

## 2021-07-29 DIAGNOSIS — D500 Iron deficiency anemia secondary to blood loss (chronic): Secondary | ICD-10-CM | POA: Diagnosis not present

## 2021-07-29 MED ORDER — SODIUM CHLORIDE 0.9 % IV SOLN
200.0000 mg | Freq: Once | INTRAVENOUS | Status: AC
  Administered 2021-07-29: 200 mg via INTRAVENOUS
  Filled 2021-07-29: qty 200

## 2021-07-29 MED ORDER — ACETAMINOPHEN 325 MG PO TABS
650.0000 mg | ORAL_TABLET | Freq: Once | ORAL | Status: AC
  Administered 2021-07-29: 650 mg via ORAL
  Filled 2021-07-29: qty 2

## 2021-07-29 MED ORDER — SODIUM CHLORIDE 0.9 % IV SOLN: Freq: Once | INTRAVENOUS | Status: AC

## 2021-07-29 MED ORDER — DIPHENHYDRAMINE HCL 25 MG PO CAPS
25.0000 mg | ORAL_CAPSULE | Freq: Once | ORAL | Status: AC
  Administered 2021-07-29: 25 mg via ORAL
  Filled 2021-07-29: qty 1

## 2021-07-29 NOTE — Patient Instructions (Signed)

## 2021-07-29 NOTE — Progress Notes (Signed)
Pt reports nausea and fatigue after last venofer infusion. She stated that she didn't have this issue when taking tylenol and benadryl prior to the iron infusion. VO from Dr. [name] for tylenol and benadryl obtained. Given per [facility]

## 2021-08-05 ENCOUNTER — Ambulatory Visit: Payer: Managed Care, Other (non HMO)

## 2021-08-05 ENCOUNTER — Other Ambulatory Visit: Payer: Self-pay

## 2021-08-05 ENCOUNTER — Inpatient Hospital Stay: Payer: Managed Care, Other (non HMO)

## 2021-08-05 VITALS — BP 119/78 | HR 62 | Temp 98.7°F | Resp 17

## 2021-08-05 DIAGNOSIS — D500 Iron deficiency anemia secondary to blood loss (chronic): Secondary | ICD-10-CM

## 2021-08-05 MED ORDER — SODIUM CHLORIDE 0.9 % IV SOLN
200.0000 mg | Freq: Once | INTRAVENOUS | Status: AC
  Administered 2021-08-05: 200 mg via INTRAVENOUS
  Filled 2021-08-05: qty 200

## 2021-08-05 MED ORDER — ACETAMINOPHEN 325 MG PO TABS
650.0000 mg | ORAL_TABLET | Freq: Once | ORAL | Status: AC
  Administered 2021-08-05: 650 mg via ORAL
  Filled 2021-08-05: qty 2

## 2021-08-05 MED ORDER — SODIUM CHLORIDE 0.9 % IV SOLN: Freq: Once | INTRAVENOUS | Status: AC

## 2021-08-05 MED ORDER — DIPHENHYDRAMINE HCL 25 MG PO CAPS
50.0000 mg | ORAL_CAPSULE | Freq: Once | ORAL | Status: AC
  Administered 2021-08-05: 50 mg via ORAL
  Filled 2021-08-05: qty 2

## 2021-08-05 NOTE — Progress Notes (Signed)
Patient stated that she has been having nausea after her iron infusions and it eventually resolves on it's own but she would like to have something to take for the nausea. Sent a message to Dr. [name] and his desk nurse Tammi RN with the above information. Patient is aware to call back if she does not hear anything by the end of the day.

## 2021-08-19 ENCOUNTER — Inpatient Hospital Stay: Payer: Managed Care, Other (non HMO)

## 2021-08-19 ENCOUNTER — Other Ambulatory Visit: Payer: Managed Care, Other (non HMO)

## 2021-08-19 ENCOUNTER — Other Ambulatory Visit: Payer: Self-pay

## 2021-08-19 ENCOUNTER — Ambulatory Visit: Payer: Managed Care, Other (non HMO)

## 2021-08-19 ENCOUNTER — Ambulatory Visit: Payer: Managed Care, Other (non HMO) | Admitting: Hematology and Oncology

## 2021-08-19 ENCOUNTER — Inpatient Hospital Stay (HOSPITAL_BASED_OUTPATIENT_CLINIC_OR_DEPARTMENT_OTHER): Payer: Managed Care, Other (non HMO) | Admitting: Hematology and Oncology

## 2021-08-19 ENCOUNTER — Inpatient Hospital Stay: Payer: Managed Care, Other (non HMO) | Attending: Hematology

## 2021-08-19 ENCOUNTER — Other Ambulatory Visit: Payer: Self-pay | Admitting: Hematology and Oncology

## 2021-08-19 VITALS — BP 118/54 | HR 73 | Temp 98.9°F | Resp 17

## 2021-08-19 VITALS — BP 125/83 | HR 85 | Temp 97.9°F | Resp 18 | Wt 318.4 lb

## 2021-08-19 DIAGNOSIS — D500 Iron deficiency anemia secondary to blood loss (chronic): Secondary | ICD-10-CM | POA: Insufficient documentation

## 2021-08-19 DIAGNOSIS — N920 Excessive and frequent menstruation with regular cycle: Secondary | ICD-10-CM | POA: Diagnosis present

## 2021-08-19 LAB — CBC WITH DIFFERENTIAL (CANCER CENTER ONLY)
Abs Immature Granulocytes: 0.01 10*3/uL (ref 0.00–0.07)
Basophils Absolute: 0.1 10*3/uL (ref 0.0–0.1)
Basophils Relative: 1 %
Eosinophils Absolute: 0.2 10*3/uL (ref 0.0–0.5)
Eosinophils Relative: 2 %
HCT: 37 % (ref 36.0–46.0)
Hemoglobin: 10.7 g/dL — ABNORMAL LOW (ref 12.0–15.0)
Immature Granulocytes: 0 %
Lymphocytes Relative: 30 %
Lymphs Abs: 2.7 10*3/uL (ref 0.7–4.0)
MCH: 22.3 pg — ABNORMAL LOW (ref 26.0–34.0)
MCHC: 28.9 g/dL — ABNORMAL LOW (ref 30.0–36.0)
MCV: 77.1 fL — ABNORMAL LOW (ref 80.0–100.0)
Monocytes Absolute: 0.5 10*3/uL (ref 0.1–1.0)
Monocytes Relative: 6 %
Neutro Abs: 5.5 10*3/uL (ref 1.7–7.7)
Neutrophils Relative %: 61 %
Platelet Count: 360 10*3/uL (ref 150–400)
RBC: 4.8 MIL/uL (ref 3.87–5.11)
RDW: 20.9 % — ABNORMAL HIGH (ref 11.5–15.5)
WBC Count: 9 10*3/uL (ref 4.0–10.5)
nRBC: 0 % (ref 0.0–0.2)

## 2021-08-19 LAB — RETIC PANEL
Immature Retic Fract: 26.4 % — ABNORMAL HIGH (ref 2.3–15.9)
RBC.: 4.76 MIL/uL (ref 3.87–5.11)
Retic Count, Absolute: 59 10*3/uL (ref 19.0–186.0)
Retic Ct Pct: 1.2 % (ref 0.4–3.1)
Reticulocyte Hemoglobin: 24.3 pg — ABNORMAL LOW (ref >27.9)

## 2021-08-19 LAB — CMP (CANCER CENTER ONLY)
ALT: 20 U/L (ref 0–44)
AST: 16 U/L (ref 15–41)
Albumin: 4.1 g/dL (ref 3.5–5.0)
Alkaline Phosphatase: 70 U/L (ref 38–126)
Anion gap: 6 (ref 5–15)
BUN: 11 mg/dL (ref 6–20)
CO2: 26 mmol/L (ref 22–32)
Calcium: 9.2 mg/dL (ref 8.9–10.3)
Chloride: 105 mmol/L (ref 98–111)
Creatinine: 0.93 mg/dL (ref 0.44–1.00)
GFR, Estimated: >60 mL/min (ref >60)
Glucose, Bld: 97 mg/dL (ref 70–99)
Potassium: 4.5 mmol/L (ref 3.5–5.1)
Sodium: 137 mmol/L (ref 135–145)
Total Bilirubin: 0.5 mg/dL (ref 0.3–1.2)
Total Protein: 7.5 g/dL (ref 6.5–8.1)

## 2021-08-19 LAB — IRON AND TIBC
Iron: 35 ug/dL — ABNORMAL LOW (ref 41–142)
Saturation Ratios: 8 % — ABNORMAL LOW (ref 21–57)
TIBC: 450 ug/dL — ABNORMAL HIGH (ref 236–444)
UIBC: 415 ug/dL — ABNORMAL HIGH (ref 120–384)

## 2021-08-19 LAB — FERRITIN: Ferritin: 86 ng/mL (ref 11–307)

## 2021-08-19 MED ORDER — SODIUM CHLORIDE 0.9 % IV SOLN
200.0000 mg | Freq: Once | INTRAVENOUS | Status: AC
  Administered 2021-08-19: 200 mg via INTRAVENOUS
  Filled 2021-08-19: qty 200

## 2021-08-19 MED ORDER — ACETAMINOPHEN 325 MG PO TABS
650.0000 mg | ORAL_TABLET | Freq: Once | ORAL | Status: AC
  Administered 2021-08-19: 650 mg via ORAL
  Filled 2021-08-19: qty 2

## 2021-08-19 MED ORDER — SODIUM CHLORIDE 0.9 % IV SOLN: Freq: Once | INTRAVENOUS | Status: AC

## 2021-08-19 MED ORDER — ONDANSETRON HCL 8 MG PO TABS: 8.0000 mg | ORAL_TABLET | Freq: Three times a day (TID) | ORAL | 0 refills | Status: DC | PRN

## 2021-08-19 MED ORDER — DIPHENHYDRAMINE HCL 25 MG PO CAPS
50.0000 mg | ORAL_CAPSULE | Freq: Once | ORAL | Status: AC
  Administered 2021-08-19: 50 mg via ORAL
  Filled 2021-08-19: qty 2

## 2021-08-19 NOTE — Patient Instructions (Signed)

## 2021-08-19 NOTE — Progress Notes (Signed)
[name] [phone]   [phone]  PROGRESS NOTE  Patient Care Team: [place]. as PCP - General [name] [name], MD as Consulting Physician (Hematology and Oncology)  Hematological/Oncological History # Iron Deficiency Anemia 2/2 to GYN Bleeding 01/03/2020: WBC 9.0, Hgb 14.2, MCV 101.4, Plt 309 06/12/2021: WBC 8.9, Hgb 7.8, MCV 73, Plt 416. Ferritin 9  07/04/2021: establish care with Dr. [name]   Interval History:  Yvonne [name] 35 y.o. female with medical history significant for iron deficiency secondary to GYN bleeding who presents for a follow up visit. The patient's last visit was on 07/05/2019. In the interim since the last visit received 4 doses of IV iron sucrose.  On exam today Yvonne [name] reports that she did have some difficulty with the IV iron infusions.  She reports that she "felt sick" and developed some small rashes and body aches after her first few doses of IV iron.  She notes that they began giving her Tylenol and Benadryl with her IV iron therapies with marked improvement of the symptoms.  She has that her energy is currently about 4 or 5 out of 10 with 10 being excellent level of energy.  She notes that she is having some shortness of breath and previously she had energy level of 1 out of 10.  She notes that she is also working with cardiology to get approval for her upcoming surgery.  She notes that she is also trying to eat iron rich foods including greens, fish, and "fiber".  She notes that her ice cravings have dissipated.  She otherwise denies any overt sources of bleeding.  She denies any fevers, chills, sweats, nausea, vomiting or diarrhea.  Full 10 point ROS is listed below.  MEDICAL HISTORY:  Past Medical History:  Diagnosis Date   Anemia    Fibroids    Obesity, Class III, BMI 40-49.9 (morbid obesity) (HCC)    BMI 40   Recurrent UTI (urinary tract infection)    Shortness of breath    "sometimes when I have  a cold"   Spasm of lung air passages     SURGICAL HISTORY: Past Surgical History:  Procedure Laterality Date   left arm surgey     ROBOT ASSISTED MYOMECTOMY  07/12/2012   Procedure: ROBOTIC ASSISTED MYOMECTOMY;  Surgeon: Lavonia Drafts, MD;  Location: [facility];  Service: Gynecology;  Laterality: N/A;   THERAPEUTIC ABORTION      SOCIAL HISTORY: Social History   Socioeconomic History   Marital status: Married    Spouse name: Not on file   Number of children: Not on file   Years of education: Not on file   Highest education level: Not on file  Occupational History   Not on file  Tobacco Use   Smoking status: Former    Packs/day: 0.25    Types: Cigarettes    Quit date: 04/26/2019    Years since quitting: 2.3   Smokeless tobacco: Never  Vaping Use   Vaping Use: Never used  Substance and Sexual Activity   Alcohol use: Not Currently    Comment: occasionally   Drug use: Not Currently    Types: Marijuana    Comment: patient states, "once in a while per month"   Sexual activity: Yes    Birth control/protection: None, Pill  Other Topics Concern   Not on file  Social History Narrative   Not on file   Social Determinants of Radio broadcast assistant  Strain: Not on file  Food Insecurity: Not on file  Transportation Needs: Not on file  Physical Activity: Not on file  Stress: Not on file  Social Connections: Not on file  Intimate Partner Violence: Not on file    FAMILY HISTORY: Family History  Problem Relation Age of Onset   Fibroids Mother    Hypertension Mother     ALLERGIES:  is allergic to blue dyes (parenteral).  MEDICATIONS:  Current Outpatient Medications  Medication Sig Dispense Refill   ondansetron (ZOFRAN) 8 MG tablet Take 1 tablet (8 mg total) by mouth every 8 (eight) hours as needed for nausea or vomiting. 20 tablet 0   albuterol (VENTOLIN HFA) 108 (90 Base) MCG/ACT inhaler 1 puff as needed     azelastine (ASTELIN) 0.1 % nasal spray       cetirizine (ZYRTEC) 10 MG tablet Take 10 mg by mouth daily as needed for allergies.     cholecalciferol (VITAMIN D3) 25 MCG (1000 UNIT) tablet 1 tablet     ferrous sulfate 325 (65 FE) MG tablet Take 650 mg by mouth daily with breakfast.     fluticasone (FLONASE) 50 MCG/ACT nasal spray Place 2 sprays into both nostrils daily as needed for allergies or rhinitis.     magnesium oxide (MAG-OX) 400 MG tablet Take by mouth.     metoprolol tartrate (LOPRESSOR) 25 MG tablet Take by mouth.     Misc. Devices MISC Initiate CPAP therapy at 9 cm. water pressure for OSA.  ResMed AirFit P10 (S) nasal pillow mask or patient preference.  Send to [facility] in [place].     SYNTHROID 175 MCG tablet Take 175 mcg by mouth daily.     traMADol (ULTRAM) 50 MG tablet Take by mouth every 6 (six) hours as needed.     traZODone (DESYREL) 50 MG tablet 1 tablet at bedtime as needed     valACYclovir (VALTREX) 500 MG tablet Take 500 mg by mouth daily as needed (outbreaks).     No current facility-administered medications for this visit.    REVIEW OF SYSTEMS:   Constitutional: ( - ) fevers, ( - )  chills , ( - ) night sweats Eyes: ( - ) blurriness of vision, ( - ) double vision, ( - ) watery eyes Ears, nose, mouth, throat, and face: ( - ) mucositis, ( - ) sore throat Respiratory: ( - ) cough, ( - ) dyspnea, ( - ) wheezes Cardiovascular: ( - ) palpitation, ( - ) chest discomfort, ( - ) lower extremity swelling Gastrointestinal:  ( - ) nausea, ( - ) heartburn, ( - ) change in bowel habits Skin: ( - ) abnormal skin rashes Lymphatics: ( - ) new lymphadenopathy, ( - ) easy bruising Neurological: ( - ) numbness, ( - ) tingling, ( - ) new weaknesses Behavioral/Psych: ( - ) mood change, ( - ) new changes  All other systems were reviewed with the patient and are negative.  PHYSICAL EXAMINATION: ECOG PERFORMANCE STATUS: 1 - Symptomatic but completely ambulatory  Vitals:   08/19/21 1414  BP: 125/83  Pulse: 85  Resp: 18   Temp: 97.9 F (36.6 C)  SpO2: 100%   Filed Weights   08/19/21 1414  Weight: (!) 318 lb 6.4 oz (144.4 kg)    GENERAL: Well-appearing middle-aged African-American female, alert, no distress and comfortable SKIN: skin color, texture, turgor are normal, no rashes or significant lesions EYES: conjunctiva are pink and non-injected, sclera clear LUNGS: clear to auscultation and percussion  with normal breathing effort HEART: regular rate & rhythm and no murmurs and no lower extremity edema Musculoskeletal: no cyanosis of digits and no clubbing  PSYCH: alert & oriented x 3, fluent speech NEURO: no focal motor/sensory deficits  LABORATORY DATA:  I have reviewed the data as listed CBC Latest Ref Rng & Units 08/19/2021 07/04/2021 01/03/2020  WBC 4.0 - 10.5 K/uL 9.0 8.6 9.0  Hemoglobin 12.0 - 15.0 g/dL 10.7(L) 7.5(L) 14.2  Hematocrit 36.0 - 46.0 % 37.0 27.8(L) 44.4  Platelets 150 - 400 K/uL 360 429(H) 309    CMP Latest Ref Rng & Units 08/19/2021 07/04/2021 01/03/2020  Glucose 70 - 99 mg/dL 97 96 93  BUN 6 - 20 mg/dL 11 11 10   Creatinine 0.44 - 1.00 mg/dL 0.93 0.81 1.04(H)  Sodium 135 - 145 mmol/L 137 137 137  Potassium 3.5 - 5.1 mmol/L 4.5 4.0 3.5  Chloride 98 - 111 mmol/L 105 107 102  CO2 22 - 32 mmol/L 26 23 25   Calcium 8.9 - 10.3 mg/dL 9.2 9.0 9.2  Total Protein 6.5 - 8.1 g/dL 7.5 6.8 7.9  Total Bilirubin 0.3 - 1.2 mg/dL 0.5 0.5 1.0  Alkaline Phos 38 - 126 U/L 70 62 61  AST 15 - 41 U/L 16 14(L) 17  ALT 0 - 44 U/L 20 13 19      RADIOGRAPHIC STUDIES: No results found.  ASSESSMENT & PLAN Yvonne [name] 35 y.o. female with medical history significant for iron deficiency secondary to GYN bleeding who presents for a follow up visit.   After review of the labs, review of the records, and discussion with the patient the patients findings are most consistent with iron deficiency anemia secondary to heavy GYN bleeding from her fibroids.  At this time treatment of iron deficiency anemia is a  two-pronged attack.  First be to replete her depleted iron stores.  She is trialed on p.o. iron therapy but given her heavy menstrual cycles I do not believe these are adequate to replete her stores.  As such I recommend we proceed with IV iron sucrose 200 mg q. 7 days x 5 doses.  The second half of this would be to stop the bleeding which is currently being managed by OB/GYN.  The patient notes that she is so symptomatic she would be willing to undergo hysterectomy despite the fact she has not had children before.  Would encourage her to continue discussions with OB/GYN to see what surgical nonsurgical options are available to her.  We will plan to see her back in approximately 2 to 4 weeks after her last dose of IV iron.   # Iron Deficiency Anemia 2/2 to GYN Bleeding -- Continue ferrous sulfate 325 mg p.o. daily with a source of vitamin C --Hgb improved to 10.7, up from 7.5 at last visit on 07/04/2021.  -- Patient is received 4 doses of IV iron sucrose 200 mg therapy, will proceed with an additional 2. --Continue to follow with OB/GYN --Plan to see the patient back in 4 weeks time after last dose of IV iron.  No orders of the defined types were placed in this encounter.   All questions were answered. The patient knows to call the clinic with any problems, questions or concerns.  A total of more than 30 minutes were spent on this encounter with face-to-face time and non-face-to-face time, including preparing to see the patient, ordering tests and/or medications, counseling the patient and coordination of care as outlined above.   Able Malloy T.  [name], MD Department of Hematology/Oncology [place] at [facility] Phone: [phone] Pager: [phone] Email: [email or web address]  08/20/2021 10:09 AM

## 2021-08-20 ENCOUNTER — Encounter: Payer: Self-pay | Admitting: Hematology and Oncology

## 2021-08-26 ENCOUNTER — Telehealth: Payer: Self-pay | Admitting: *Deleted

## 2021-08-26 ENCOUNTER — Ambulatory Visit: Payer: Managed Care, Other (non HMO)

## 2021-08-26 NOTE — Telephone Encounter (Addendum)
Incorrect notation on chart previously. deleted

## 2021-08-27 ENCOUNTER — Telehealth: Payer: Self-pay | Admitting: *Deleted

## 2021-08-27 ENCOUNTER — Other Ambulatory Visit: Payer: Self-pay | Admitting: Hematology and Oncology

## 2021-08-27 NOTE — Telephone Encounter (Signed)
Additional iron sucrose ordered

## 2021-08-27 NOTE — Telephone Encounter (Signed)
Patient called questioning why her Iron infusion for 08/26/2021 was canceled.  She states she was told by Dr [name] that she would have an additional iron infusion as she her Hgb needs to be >= 12 before she can have a hysterectomy.  She is currently menstrating and expected that it would cause an additional drop that could prevent her from getting the surgery and that was why Dr [name] agreed to doing one more.  Treatment plan only shows 5 treatments in total which have already been completed. MD visit from 08/19/2021 does state 2 additional which at that time would equal 6 in total.  Need confirmation from provider and update of treatment plant o show 6 in total.  Routed to Dr [name], please advise.

## 2021-08-27 NOTE — Telephone Encounter (Signed)
OK to reschedule for 1 additional IV iron sucrose treatment. OK to schedule at outside site ([facility], [facility], etc) if needed.

## 2021-09-04 ENCOUNTER — Ambulatory Visit: Payer: Managed Care, Other (non HMO)

## 2021-09-04 ENCOUNTER — Inpatient Hospital Stay: Payer: Managed Care, Other (non HMO)

## 2021-09-09 ENCOUNTER — Other Ambulatory Visit: Payer: Self-pay

## 2021-09-09 ENCOUNTER — Inpatient Hospital Stay: Payer: Managed Care, Other (non HMO)

## 2021-09-09 VITALS — BP 112/68 | HR 95 | Temp 98.8°F | Resp 18

## 2021-09-09 DIAGNOSIS — D500 Iron deficiency anemia secondary to blood loss (chronic): Secondary | ICD-10-CM

## 2021-09-09 MED ORDER — ACETAMINOPHEN 325 MG PO TABS
650.0000 mg | ORAL_TABLET | Freq: Once | ORAL | Status: AC
  Administered 2021-09-09: 15:00:00 650 mg via ORAL
  Filled 2021-09-09: qty 2

## 2021-09-09 MED ORDER — SODIUM CHLORIDE 0.9 % IV SOLN: Freq: Once | INTRAVENOUS | Status: AC

## 2021-09-09 MED ORDER — SODIUM CHLORIDE 0.9 % IV SOLN
200.0000 mg | Freq: Once | INTRAVENOUS | Status: AC
  Administered 2021-09-09: 16:00:00 200 mg via INTRAVENOUS
  Filled 2021-09-09: qty 200

## 2021-09-09 MED ORDER — DIPHENHYDRAMINE HCL 25 MG PO CAPS
50.0000 mg | ORAL_CAPSULE | Freq: Once | ORAL | Status: AC
  Administered 2021-09-09: 15:00:00 50 mg via ORAL
  Filled 2021-09-09: qty 2

## 2021-09-09 NOTE — Progress Notes (Signed)
Added diphenhydramine 50 mg and acetaminophen 650 mg as pre-medications for Venofer infusions for previous fatigue and nausea with infusion per MD request

## 2021-09-09 NOTE — Patient Instructions (Signed)

## 2021-10-09 ENCOUNTER — Other Ambulatory Visit: Payer: Self-pay

## 2021-10-09 ENCOUNTER — Inpatient Hospital Stay: Payer: Managed Care, Other (non HMO) | Attending: Hematology

## 2021-10-09 ENCOUNTER — Inpatient Hospital Stay (HOSPITAL_BASED_OUTPATIENT_CLINIC_OR_DEPARTMENT_OTHER): Payer: Managed Care, Other (non HMO) | Admitting: Hematology and Oncology

## 2021-10-09 ENCOUNTER — Other Ambulatory Visit: Payer: Self-pay | Admitting: Hematology and Oncology

## 2021-10-09 VITALS — BP 124/58 | HR 73 | Temp 97.7°F | Resp 20 | Wt 322.9 lb

## 2021-10-09 DIAGNOSIS — D500 Iron deficiency anemia secondary to blood loss (chronic): Secondary | ICD-10-CM | POA: Diagnosis present

## 2021-10-09 DIAGNOSIS — N920 Excessive and frequent menstruation with regular cycle: Secondary | ICD-10-CM | POA: Insufficient documentation

## 2021-10-09 DIAGNOSIS — Z87891 Personal history of nicotine dependence: Secondary | ICD-10-CM | POA: Insufficient documentation

## 2021-10-09 LAB — CBC WITH DIFFERENTIAL (CANCER CENTER ONLY)
Abs Immature Granulocytes: 0.02 10*3/uL (ref 0.00–0.07)
Basophils Absolute: 0.1 10*3/uL (ref 0.0–0.1)
Basophils Relative: 1 %
Eosinophils Absolute: 0.2 10*3/uL (ref 0.0–0.5)
Eosinophils Relative: 3 %
HCT: 32.4 % — ABNORMAL LOW (ref 36.0–46.0)
Hemoglobin: 9.7 g/dL — ABNORMAL LOW (ref 12.0–15.0)
Immature Granulocytes: 0 %
Lymphocytes Relative: 34 %
Lymphs Abs: 3.3 10*3/uL (ref 0.7–4.0)
MCH: 24.3 pg — ABNORMAL LOW (ref 26.0–34.0)
MCHC: 29.9 g/dL — ABNORMAL LOW (ref 30.0–36.0)
MCV: 81.2 fL (ref 80.0–100.0)
Monocytes Absolute: 0.6 10*3/uL (ref 0.1–1.0)
Monocytes Relative: 7 %
Neutro Abs: 5.4 10*3/uL (ref 1.7–7.7)
Neutrophils Relative %: 55 %
Platelet Count: 338 10*3/uL (ref 150–400)
RBC: 3.99 MIL/uL (ref 3.87–5.11)
RDW: 17.2 % — ABNORMAL HIGH (ref 11.5–15.5)
WBC Count: 9.6 10*3/uL (ref 4.0–10.5)
nRBC: 0 % (ref 0.0–0.2)

## 2021-10-09 LAB — RETIC PANEL
Immature Retic Fract: 36 % — ABNORMAL HIGH (ref 2.3–15.9)
RBC.: 3.94 MIL/uL (ref 3.87–5.11)
Retic Count, Absolute: 51.2 10*3/uL (ref 19.0–186.0)
Retic Ct Pct: 1.3 % (ref 0.4–3.1)
Reticulocyte Hemoglobin: 27.3 pg — ABNORMAL LOW (ref >27.9)

## 2021-10-09 LAB — IRON AND IRON BINDING CAPACITY (CC-WL,HP ONLY)
Iron: 34 ug/dL (ref 28–170)
Saturation Ratios: 8 % — ABNORMAL LOW (ref 10.4–31.8)
TIBC: 406 ug/dL (ref 250–450)
UIBC: 372 ug/dL (ref 148–442)

## 2021-10-09 LAB — CMP (CANCER CENTER ONLY)
ALT: 11 U/L (ref 0–44)
AST: 11 U/L — ABNORMAL LOW (ref 15–41)
Albumin: 3.7 g/dL (ref 3.5–5.0)
Alkaline Phosphatase: 58 U/L (ref 38–126)
Anion gap: 7 (ref 5–15)
BUN: 13 mg/dL (ref 6–20)
CO2: 22 mmol/L (ref 22–32)
Calcium: 8.8 mg/dL — ABNORMAL LOW (ref 8.9–10.3)
Chloride: 105 mmol/L (ref 98–111)
Creatinine: 0.84 mg/dL (ref 0.44–1.00)
GFR, Estimated: >60 mL/min (ref >60)
Glucose, Bld: 96 mg/dL (ref 70–99)
Potassium: 3.8 mmol/L (ref 3.5–5.1)
Sodium: 134 mmol/L — ABNORMAL LOW (ref 135–145)
Total Bilirubin: 0.6 mg/dL (ref 0.3–1.2)
Total Protein: 6.6 g/dL (ref 6.5–8.1)

## 2021-10-09 LAB — FERRITIN: Ferritin: 27 ng/mL (ref 11–307)

## 2021-10-09 NOTE — Progress Notes (Signed)
[place] [phone]   [phone]  PROGRESS NOTE  Patient Care Team: [place]. as PCP - General [name] [name], MD as Consulting Physician (Hematology and Oncology)  Hematological/Oncological History # Iron Deficiency Anemia 2/2 to GYN Bleeding 01/03/2020: WBC 9.0, Hgb 14.2, MCV 101.4, Plt 309 06/12/2021: WBC 8.9, Hgb 7.8, MCV 73, Plt 416. Ferritin 9  07/04/2021: establish care with Dr. [name]  08/11/2021-09/09/2021: 6 x does of IV iron sucrose 200 mg  10/09/2021: Hgb 9.7, Sat ratio 8%, Ferritin 27  Interval History:  Yvonne [name] 35 y.o. female with medical history significant for iron deficiency secondary to GYN bleeding who presents for a follow up visit. The patient's last visit was on 08/27/2021. In the interim since the last visit received 6 doses of IV iron sucrose.  On exam today Ms. Forgione reports she did get a nice boost in energy with her prior IV iron therapy.  She notes that her craving for ice are not as strong but that she does still periodically have ice cravings.  She does have occasional issues with heart flutters for which she is receiving a cardiac MRI next month.  She also reports that she has been having some swelling in her legs with sore and swollen feet.  Her menstrual cycles have been quite heavy in the interim since her last visit.  She notes that her last one lasted for approximately 10 days and there was lingering spotting afterwards.  She notes that her last cycle was "terrible" with lots of clotting.  She is currently taking iron pills which are causing some moderate constipation.  She otherwise denies any overt sources of bleeding.  She denies any fevers, chills, sweats, nausea, vomiting or diarrhea.  Full 10 point ROS is listed below.  MEDICAL HISTORY:  Past Medical History:  Diagnosis Date   Anemia    Fibroids    Obesity, Class III, BMI 40-49.9 (morbid obesity) (HCC)    BMI 40   Recurrent UTI  (urinary tract infection)    Shortness of breath    "sometimes when I have a cold"   Spasm of lung air passages     SURGICAL HISTORY: Past Surgical History:  Procedure Laterality Date   left arm surgey     ROBOT ASSISTED MYOMECTOMY  07/12/2012   Procedure: ROBOTIC ASSISTED MYOMECTOMY;  Surgeon: Lavonia Drafts, MD;  Location: [facility];  Service: Gynecology;  Laterality: N/A;   THERAPEUTIC ABORTION      SOCIAL HISTORY: Social History   Socioeconomic History   Marital status: Married    Spouse name: Not on file   Number of children: Not on file   Years of education: Not on file   Highest education level: Not on file  Occupational History   Not on file  Tobacco Use   Smoking status: Former    Packs/day: 0.25    Types: Cigarettes    Quit date: 04/26/2019    Years since quitting: 2.4   Smokeless tobacco: Never  Vaping Use   Vaping Use: Never used  Substance and Sexual Activity   Alcohol use: Not Currently    Comment: occasionally   Drug use: Not Currently    Types: Marijuana    Comment: patient states, "once in a while per month"   Sexual activity: Yes    Birth control/protection: None, Pill  Other Topics Concern   Not on file  Social History Narrative   Not on file   Social  Determinants of Health   Financial Resource Strain: Not on file  Food Insecurity: Not on file  Transportation Needs: Not on file  Physical Activity: Not on file  Stress: Not on file  Social Connections: Not on file  Intimate Partner Violence: Not on file    FAMILY HISTORY: Family History  Problem Relation Age of Onset   Fibroids Mother    Hypertension Mother     ALLERGIES:  is allergic to blue dyes (parenteral).  MEDICATIONS:  Current Outpatient Medications  Medication Sig Dispense Refill   albuterol (VENTOLIN HFA) 108 (90 Base) MCG/ACT inhaler 1 puff as needed     azelastine (ASTELIN) 0.1 % nasal spray      cetirizine (ZYRTEC) 10 MG tablet Take 10 mg by mouth daily as needed  for allergies.     cholecalciferol (VITAMIN D3) 25 MCG (1000 UNIT) tablet 1 tablet     ferrous sulfate 325 (65 FE) MG tablet Take 650 mg by mouth daily with breakfast.     fluticasone (FLONASE) 50 MCG/ACT nasal spray Place 2 sprays into both nostrils daily as needed for allergies or rhinitis.     magnesium oxide (MAG-OX) 400 MG tablet Take by mouth.     metoprolol tartrate (LOPRESSOR) 25 MG tablet Take by mouth.     Misc. Devices MISC Initiate CPAP therapy at 9 cm. water pressure for OSA.  ResMed AirFit P10 (S) nasal pillow mask or patient preference.  Send to [facility] in [place].     ondansetron (ZOFRAN) 8 MG tablet Take 1 tablet (8 mg total) by mouth every 8 (eight) hours as needed for nausea or vomiting. 20 tablet 0   SYNTHROID 175 MCG tablet Take 175 mcg by mouth daily.     traMADol (ULTRAM) 50 MG tablet Take by mouth every 6 (six) hours as needed.     traZODone (DESYREL) 50 MG tablet 1 tablet at bedtime as needed     valACYclovir (VALTREX) 500 MG tablet Take 500 mg by mouth daily as needed (outbreaks).     No current facility-administered medications for this visit.    REVIEW OF SYSTEMS:   Constitutional: ( - ) fevers, ( - )  chills , ( - ) night sweats Eyes: ( - ) blurriness of vision, ( - ) double vision, ( - ) watery eyes Ears, nose, mouth, throat, and face: ( - ) mucositis, ( - ) sore throat Respiratory: ( - ) cough, ( - ) dyspnea, ( - ) wheezes Cardiovascular: ( - ) palpitation, ( - ) chest discomfort, ( - ) lower extremity swelling Gastrointestinal:  ( - ) nausea, ( - ) heartburn, ( - ) change in bowel habits Skin: ( - ) abnormal skin rashes Lymphatics: ( - ) new lymphadenopathy, ( - ) easy bruising Neurological: ( - ) numbness, ( - ) tingling, ( - ) new weaknesses Behavioral/Psych: ( - ) mood change, ( - ) new changes  All other systems were reviewed with the patient and are negative.  PHYSICAL EXAMINATION: ECOG PERFORMANCE STATUS: 1 - Symptomatic but completely  ambulatory  Vitals:   10/09/21 1119  BP: (!) 124/58  Pulse: 73  Resp: 20  Temp: 97.7 F (36.5 C)  SpO2: 100%   Filed Weights   10/09/21 1119  Weight: (!) 322 lb 14.4 oz (146.5 kg)    GENERAL: Well-appearing middle-aged African-American female, alert, no distress and comfortable SKIN: skin color, texture, turgor are normal, no rashes or significant lesions EYES: conjunctiva are pink and non-injected,  sclera clear LUNGS: clear to auscultation and percussion with normal breathing effort HEART: regular rate & rhythm and no murmurs and no lower extremity edema Musculoskeletal: no cyanosis of digits and no clubbing  PSYCH: alert & oriented x 3, fluent speech NEURO: no focal motor/sensory deficits  LABORATORY DATA:  I have reviewed the data as listed CBC Latest Ref Rng & Units 10/09/2021 08/19/2021 07/04/2021  WBC 4.0 - 10.5 K/uL 9.6 9.0 8.6  Hemoglobin 12.0 - 15.0 g/dL 9.7(L) 10.7(L) 7.5(L)  Hematocrit 36.0 - 46.0 % 32.4(L) 37.0 27.8(L)  Platelets 150 - 400 K/uL 338 360 429(H)    CMP Latest Ref Rng & Units 10/09/2021 08/19/2021 07/04/2021  Glucose 70 - 99 mg/dL 96 97 96  BUN 6 - 20 mg/dL 13 11 11   Creatinine 0.44 - 1.00 mg/dL 0.84 0.93 0.81  Sodium 135 - 145 mmol/L 134(L) 137 137  Potassium 3.5 - 5.1 mmol/L 3.8 4.5 4.0  Chloride 98 - 111 mmol/L 105 105 107  CO2 22 - 32 mmol/L 22 26 23   Calcium 8.9 - 10.3 mg/dL 8.8(L) 9.2 9.0  Total Protein 6.5 - 8.1 g/dL 6.6 7.5 6.8  Total Bilirubin 0.3 - 1.2 mg/dL 0.6 0.5 0.5  Alkaline Phos 38 - 126 U/L 58 70 62  AST 15 - 41 U/L 11(L) 16 14(L)  ALT 0 - 44 U/L 11 20 13      RADIOGRAPHIC STUDIES: No results found.  ASSESSMENT & PLAN Yvonne [name] 35 y.o. female with medical history significant for iron deficiency secondary to GYN bleeding who presents for a follow up visit.   After review of the labs, review of the records, and discussion with the patient the patients findings are most consistent with iron deficiency anemia secondary to  heavy GYN bleeding from her fibroids.  At this time treatment of iron deficiency anemia is a two-pronged attack.  First be to replete her depleted iron stores.  She is trialed on p.o. iron therapy but given her heavy menstrual cycles I do not believe these are adequate to replete her stores.  As such I recommend we proceed with IV iron sucrose 200 mg q. 7 days x 5 doses.  The second half of this would be to stop the bleeding which is currently being managed by OB/GYN.  The patient notes that she is so symptomatic she would be willing to undergo hysterectomy despite the fact she has not had children before.  Would encourage her to continue discussions with OB/GYN to see what surgical nonsurgical options are available to her.  We will plan to see her back in approximately 4-6 weeks after her last dose of IV iron.   # Iron Deficiency Anemia 2/2 to GYN Bleeding -- Continue ferrous sulfate 325 mg p.o. daily with a source of vitamin C --Hgb dropped to 9.7, down from last visit at 10.7 on 08/19/21.  -- Patient received 6 total doses of IV iron sucrose 200 mg therapy --based on iron labs from today patient will need repeat infusions.  --patient has missed a considerable amount of work due to numerous iron sucrose sessions which did not adequately keep up her iron levels. We will request monoferric.  --Continue to follow with OB/GYN for better menstrual cycle control  --Plan to see the patient back in 4-6 weeks time after last dose of IV iron.  No orders of the defined types were placed in this encounter.    All questions were answered. The patient knows to call the clinic with any  problems, questions or concerns.  A total of more than 30 minutes were spent on this encounter with face-to-face time and non-face-to-face time, including preparing to see the patient, ordering tests and/or medications, counseling the patient and coordination of care as outlined above.   Ledell Peoples, MD Department of  Hematology/Oncology [place] at [facility] Phone: [phone] Pager: [phone] Email: [email or web address]  10/15/2021 11:47 AM

## 2021-10-13 DIAGNOSIS — I517 Cardiomegaly: Secondary | ICD-10-CM

## 2021-10-13 DIAGNOSIS — R490 Dysphonia: Secondary | ICD-10-CM

## 2021-10-13 HISTORY — DX: Dysphonia: R49.0

## 2021-10-13 HISTORY — DX: Cardiomegaly: I51.7

## 2021-10-15 ENCOUNTER — Encounter: Payer: Self-pay | Admitting: Hematology and Oncology

## 2021-10-22 ENCOUNTER — Other Ambulatory Visit: Payer: Self-pay | Admitting: Hematology and Oncology

## 2021-10-22 ENCOUNTER — Telehealth: Payer: Self-pay | Admitting: Pharmacy Technician

## 2021-10-22 NOTE — Telephone Encounter (Signed)
error 

## 2021-10-22 NOTE — Telephone Encounter (Signed)
Dr. Trude Mcburney Submission: DENIED Payer: [employer] Medication & CPT/J Code(s) submitted: MONOFERRIC Route of submission (phone, fax, portal): PORTAL Auth type: Buy/Bill Units/visits requested: 1 Denied due to patient has not tried and or failed step therapy. Venofer Infed Ferrlecit  Would you like to try Venofer?  Please advise.

## 2021-10-23 NOTE — Telephone Encounter (Signed)
I will request an urgent Appeal or a Peer to Peer request.  I will f/u once I have a response

## 2021-10-23 NOTE — Telephone Encounter (Signed)
The patient did fail therapy with Venofer. She is not able to take off more work for multiple sessions. Can we try again for monoferric?

## 2021-10-25 NOTE — Telephone Encounter (Addendum)
Dr. [name], Upon further review of her insurance, unfortunately our facility is out of network.  We are working on this and hopefully this will be resolved shortly. To prevent delay in treatment please refer patient to another site.

## 2021-11-01 ENCOUNTER — Ambulatory Visit: Payer: Managed Care, Other (non HMO)

## 2021-11-01 DIAGNOSIS — R002 Palpitations: Secondary | ICD-10-CM

## 2021-11-01 DIAGNOSIS — F32A Depression, unspecified: Secondary | ICD-10-CM

## 2021-11-01 HISTORY — DX: Palpitations: R00.2

## 2021-11-01 HISTORY — DX: Depression, unspecified: F32.A

## 2021-11-06 ENCOUNTER — Telehealth: Payer: Self-pay | Admitting: Hematology and Oncology

## 2021-11-06 ENCOUNTER — Telehealth: Payer: Self-pay | Admitting: *Deleted

## 2021-11-06 NOTE — Telephone Encounter (Signed)
Sch per 1/25 inbasket, pt aware °

## 2021-11-06 NOTE — Telephone Encounter (Signed)
Received vm message from pt asking for her IV iron infusion appts to be be scheduled here. She was originally scheduled at the [facility] center but her insurance told her that location was out of network. Scheduling message sent to have her infusions scheduled here @ [facility]

## 2021-11-11 ENCOUNTER — Inpatient Hospital Stay: Payer: Managed Care, Other (non HMO) | Attending: Hematology

## 2021-11-11 ENCOUNTER — Other Ambulatory Visit: Payer: Self-pay

## 2021-11-11 VITALS — BP 108/71 | HR 77 | Temp 98.9°F | Resp 18

## 2021-11-11 DIAGNOSIS — D500 Iron deficiency anemia secondary to blood loss (chronic): Secondary | ICD-10-CM | POA: Insufficient documentation

## 2021-11-11 DIAGNOSIS — N920 Excessive and frequent menstruation with regular cycle: Secondary | ICD-10-CM | POA: Insufficient documentation

## 2021-11-11 DIAGNOSIS — Z87891 Personal history of nicotine dependence: Secondary | ICD-10-CM | POA: Insufficient documentation

## 2021-11-11 MED ORDER — ACETAMINOPHEN 325 MG PO TABS
650.0000 mg | ORAL_TABLET | Freq: Once | ORAL | Status: AC
  Administered 2021-11-11: 650 mg via ORAL
  Filled 2021-11-11: qty 2

## 2021-11-11 MED ORDER — DIPHENHYDRAMINE HCL 25 MG PO CAPS
50.0000 mg | ORAL_CAPSULE | Freq: Once | ORAL | Status: AC
  Administered 2021-11-11: 50 mg via ORAL
  Filled 2021-11-11: qty 2

## 2021-11-11 MED ORDER — SODIUM CHLORIDE 0.9 % IV SOLN: Freq: Once | INTRAVENOUS | Status: AC

## 2021-11-11 MED ORDER — SODIUM CHLORIDE 0.9 % IV SOLN
200.0000 mg | Freq: Once | INTRAVENOUS | Status: AC
  Administered 2021-11-11: 200 mg via INTRAVENOUS
  Filled 2021-11-11: qty 200

## 2021-11-11 NOTE — Progress Notes (Signed)
Pt observed for 30 mins post iron transfusion. Tolerated well, no complaints at time of discharge. VSS, ambulatory to lobby.

## 2021-11-11 NOTE — Patient Instructions (Signed)

## 2021-11-18 ENCOUNTER — Inpatient Hospital Stay: Payer: Managed Care, Other (non HMO)

## 2021-11-20 ENCOUNTER — Telehealth: Payer: Self-pay | Admitting: Hematology and Oncology

## 2021-11-20 NOTE — Telephone Encounter (Signed)
Sch per 2/7 pt req to r/s, pt aware

## 2021-11-21 ENCOUNTER — Other Ambulatory Visit: Payer: Self-pay

## 2021-11-21 ENCOUNTER — Inpatient Hospital Stay: Payer: Managed Care, Other (non HMO) | Attending: Hematology

## 2021-11-21 VITALS — BP 116/65 | HR 70 | Temp 98.6°F | Resp 16

## 2021-11-21 DIAGNOSIS — D500 Iron deficiency anemia secondary to blood loss (chronic): Secondary | ICD-10-CM | POA: Diagnosis not present

## 2021-11-21 DIAGNOSIS — N920 Excessive and frequent menstruation with regular cycle: Secondary | ICD-10-CM | POA: Diagnosis present

## 2021-11-21 DIAGNOSIS — Z87891 Personal history of nicotine dependence: Secondary | ICD-10-CM | POA: Diagnosis not present

## 2021-11-21 MED ORDER — DIPHENHYDRAMINE HCL 25 MG PO CAPS
50.0000 mg | ORAL_CAPSULE | Freq: Once | ORAL | Status: AC
  Administered 2021-11-21: 50 mg via ORAL
  Filled 2021-11-21: qty 2

## 2021-11-21 MED ORDER — ACETAMINOPHEN 325 MG PO TABS
650.0000 mg | ORAL_TABLET | Freq: Once | ORAL | Status: AC
  Administered 2021-11-21: 650 mg via ORAL
  Filled 2021-11-21: qty 2

## 2021-11-21 MED ORDER — SODIUM CHLORIDE 0.9 % IV SOLN: Freq: Once | INTRAVENOUS | Status: AC

## 2021-11-21 MED ORDER — SODIUM CHLORIDE 0.9 % IV SOLN
200.0000 mg | Freq: Once | INTRAVENOUS | Status: AC
  Administered 2021-11-21: 200 mg via INTRAVENOUS
  Filled 2021-11-21: qty 200

## 2021-11-21 NOTE — Patient Instructions (Signed)

## 2021-11-22 ENCOUNTER — Ambulatory Visit: Payer: Managed Care, Other (non HMO)

## 2021-11-25 ENCOUNTER — Other Ambulatory Visit: Payer: Self-pay

## 2021-11-25 ENCOUNTER — Inpatient Hospital Stay: Payer: Managed Care, Other (non HMO)

## 2021-11-25 VITALS — BP 114/65 | HR 67 | Temp 98.1°F | Resp 16

## 2021-11-25 DIAGNOSIS — D500 Iron deficiency anemia secondary to blood loss (chronic): Secondary | ICD-10-CM

## 2021-11-25 MED ORDER — SODIUM CHLORIDE 0.9 % IV SOLN
200.0000 mg | Freq: Once | INTRAVENOUS | Status: AC
  Administered 2021-11-25: 200 mg via INTRAVENOUS
  Filled 2021-11-25: qty 200

## 2021-11-25 MED ORDER — ACETAMINOPHEN 325 MG PO TABS
650.0000 mg | ORAL_TABLET | Freq: Once | ORAL | Status: AC
  Administered 2021-11-25: 650 mg via ORAL
  Filled 2021-11-25: qty 2

## 2021-11-25 MED ORDER — SODIUM CHLORIDE 0.9 % IV SOLN: Freq: Once | INTRAVENOUS | Status: AC

## 2021-11-25 MED ORDER — DIPHENHYDRAMINE HCL 25 MG PO CAPS
50.0000 mg | ORAL_CAPSULE | Freq: Once | ORAL | Status: AC
  Administered 2021-11-25: 50 mg via ORAL
  Filled 2021-11-25: qty 2

## 2021-11-25 NOTE — Patient Instructions (Signed)

## 2021-12-02 ENCOUNTER — Other Ambulatory Visit: Payer: Self-pay

## 2021-12-02 ENCOUNTER — Inpatient Hospital Stay: Payer: Managed Care, Other (non HMO)

## 2021-12-02 VITALS — BP 127/92 | HR 63 | Temp 97.8°F | Resp 16 | Wt 310.4 lb

## 2021-12-02 DIAGNOSIS — D500 Iron deficiency anemia secondary to blood loss (chronic): Secondary | ICD-10-CM

## 2021-12-02 MED ORDER — SODIUM CHLORIDE 0.9 % IV SOLN: Freq: Once | INTRAVENOUS | Status: AC

## 2021-12-02 MED ORDER — DIPHENHYDRAMINE HCL 25 MG PO CAPS
50.0000 mg | ORAL_CAPSULE | Freq: Once | ORAL | Status: AC
  Administered 2021-12-02: 50 mg via ORAL
  Filled 2021-12-02: qty 2

## 2021-12-02 MED ORDER — ACETAMINOPHEN 325 MG PO TABS
650.0000 mg | ORAL_TABLET | Freq: Once | ORAL | Status: AC
  Administered 2021-12-02: 650 mg via ORAL
  Filled 2021-12-02: qty 2

## 2021-12-02 MED ORDER — SODIUM CHLORIDE 0.9 % IV SOLN
200.0000 mg | Freq: Once | INTRAVENOUS | Status: AC
  Administered 2021-12-02: 200 mg via INTRAVENOUS
  Filled 2021-12-02: qty 200

## 2021-12-02 NOTE — Patient Instructions (Signed)

## 2021-12-02 NOTE — Progress Notes (Signed)
Patient tolerated iron infusion well. Patient unable to stay for 30 minute post iron infusion observation. VSS at discharge.

## 2021-12-07 ENCOUNTER — Other Ambulatory Visit: Payer: Self-pay | Admitting: *Deleted

## 2021-12-09 ENCOUNTER — Other Ambulatory Visit: Payer: Self-pay

## 2021-12-09 ENCOUNTER — Inpatient Hospital Stay: Payer: Managed Care, Other (non HMO)

## 2021-12-09 ENCOUNTER — Other Ambulatory Visit: Payer: Self-pay | Admitting: Hematology and Oncology

## 2021-12-09 VITALS — BP 112/64 | HR 66 | Temp 98.4°F | Resp 18

## 2021-12-09 DIAGNOSIS — D500 Iron deficiency anemia secondary to blood loss (chronic): Secondary | ICD-10-CM | POA: Diagnosis not present

## 2021-12-09 MED ORDER — SODIUM CHLORIDE 0.9 % IV SOLN: Freq: Once | INTRAVENOUS | Status: AC

## 2021-12-09 MED ORDER — DIPHENHYDRAMINE HCL 25 MG PO CAPS
50.0000 mg | ORAL_CAPSULE | Freq: Once | ORAL | Status: AC
  Administered 2021-12-09: 50 mg via ORAL
  Filled 2021-12-09: qty 2

## 2021-12-09 MED ORDER — ACETAMINOPHEN 325 MG PO TABS
650.0000 mg | ORAL_TABLET | Freq: Once | ORAL | Status: AC
  Administered 2021-12-09: 650 mg via ORAL
  Filled 2021-12-09: qty 2

## 2021-12-09 MED ORDER — SODIUM CHLORIDE 0.9 % IV SOLN
200.0000 mg | Freq: Once | INTRAVENOUS | Status: AC
  Administered 2021-12-09: 200 mg via INTRAVENOUS
  Filled 2021-12-09: qty 200

## 2021-12-09 NOTE — Progress Notes (Signed)
Patient declined to stay for 30-minute post observation. 

## 2021-12-11 ENCOUNTER — Other Ambulatory Visit: Payer: Managed Care, Other (non HMO)

## 2021-12-11 ENCOUNTER — Ambulatory Visit: Payer: Managed Care, Other (non HMO) | Admitting: Hematology and Oncology

## 2021-12-12 ENCOUNTER — Telehealth: Payer: Self-pay | Admitting: *Deleted

## 2021-12-12 NOTE — Telephone Encounter (Signed)
Received vm message from pt. She states she saw her PCP yesterday. Labs done, HGB is 11.7 ?Her last iron infusion was 12/09/21. ?She is asking if she is at a point that she can have a hysterectomy. Or does she need more IV iron. She is scheduled to see [place] 01/06/22 ? ?Please advise ?

## 2021-12-13 NOTE — Telephone Encounter (Signed)
TCT patient  regarding need for additional IV iron. ?Per [name]: ?Hgb of >11.0 is needed for hysterectomy. She is at target goal of 11.7. Recommend she discuss this with her OB provider to get the procedure scheduled in the near future.  ? ?This message was left on her identified vm . ?

## 2022-01-02 ENCOUNTER — Other Ambulatory Visit: Payer: Self-pay | Admitting: *Deleted

## 2022-01-02 DIAGNOSIS — R0602 Shortness of breath: Secondary | ICD-10-CM

## 2022-01-02 DIAGNOSIS — D500 Iron deficiency anemia secondary to blood loss (chronic): Secondary | ICD-10-CM

## 2022-01-02 HISTORY — DX: Shortness of breath: R06.02

## 2022-01-06 ENCOUNTER — Inpatient Hospital Stay: Payer: Managed Care, Other (non HMO) | Attending: Hematology

## 2022-01-06 ENCOUNTER — Inpatient Hospital Stay (HOSPITAL_BASED_OUTPATIENT_CLINIC_OR_DEPARTMENT_OTHER): Payer: Managed Care, Other (non HMO) | Admitting: Hematology and Oncology

## 2022-01-06 ENCOUNTER — Other Ambulatory Visit: Payer: Self-pay

## 2022-01-06 VITALS — BP 119/77 | HR 64 | Temp 97.7°F | Resp 16 | Wt 319.0 lb

## 2022-01-06 DIAGNOSIS — N920 Excessive and frequent menstruation with regular cycle: Secondary | ICD-10-CM | POA: Insufficient documentation

## 2022-01-06 DIAGNOSIS — D500 Iron deficiency anemia secondary to blood loss (chronic): Secondary | ICD-10-CM | POA: Insufficient documentation

## 2022-01-06 LAB — CMP (CANCER CENTER ONLY)
ALT: 13 U/L (ref 0–44)
AST: 14 U/L — ABNORMAL LOW (ref 15–41)
Albumin: 3.9 g/dL (ref 3.5–5.0)
Alkaline Phosphatase: 70 U/L (ref 38–126)
Anion gap: 6 (ref 5–15)
BUN: 13 mg/dL (ref 6–20)
CO2: 27 mmol/L (ref 22–32)
Calcium: 9 mg/dL (ref 8.9–10.3)
Chloride: 106 mmol/L (ref 98–111)
Creatinine: 0.84 mg/dL (ref 0.44–1.00)
GFR, Estimated: >60 mL/min (ref >60)
Glucose, Bld: 101 mg/dL — ABNORMAL HIGH (ref 70–99)
Potassium: 4 mmol/L (ref 3.5–5.1)
Sodium: 139 mmol/L (ref 135–145)
Total Bilirubin: 0.4 mg/dL (ref 0.3–1.2)
Total Protein: 6.9 g/dL (ref 6.5–8.1)

## 2022-01-06 LAB — CBC WITH DIFFERENTIAL (CANCER CENTER ONLY)
Abs Immature Granulocytes: 0.01 10*3/uL (ref 0.00–0.07)
Basophils Absolute: 0.1 10*3/uL (ref 0.0–0.1)
Basophils Relative: 1 %
Eosinophils Absolute: 0.2 10*3/uL (ref 0.0–0.5)
Eosinophils Relative: 3 %
HCT: 39.2 % (ref 36.0–46.0)
Hemoglobin: 12 g/dL (ref 12.0–15.0)
Immature Granulocytes: 0 %
Lymphocytes Relative: 34 %
Lymphs Abs: 2.5 10*3/uL (ref 0.7–4.0)
MCH: 26.2 pg (ref 26.0–34.0)
MCHC: 30.6 g/dL (ref 30.0–36.0)
MCV: 85.6 fL (ref 80.0–100.0)
Monocytes Absolute: 0.4 10*3/uL (ref 0.1–1.0)
Monocytes Relative: 5 %
Neutro Abs: 4.2 10*3/uL (ref 1.7–7.7)
Neutrophils Relative %: 57 %
Platelet Count: 296 10*3/uL (ref 150–400)
RBC: 4.58 MIL/uL (ref 3.87–5.11)
RDW: 17 % — ABNORMAL HIGH (ref 11.5–15.5)
WBC Count: 7.3 10*3/uL (ref 4.0–10.5)
nRBC: 0 % (ref 0.0–0.2)

## 2022-01-06 LAB — IRON AND IRON BINDING CAPACITY (CC-WL,HP ONLY)
Iron: 50 ug/dL (ref 28–170)
Saturation Ratios: 12 % (ref 10.4–31.8)
TIBC: 412 ug/dL (ref 250–450)
UIBC: 362 ug/dL (ref 148–442)

## 2022-01-06 LAB — RETIC PANEL
Immature Retic Fract: 13.2 % (ref 2.3–15.9)
RBC.: 4.5 MIL/uL (ref 3.87–5.11)
Retic Count, Absolute: 40.1 10*3/uL (ref 19.0–186.0)
Retic Ct Pct: 0.9 % (ref 0.4–3.1)
Reticulocyte Hemoglobin: 30.6 pg (ref >27.9)

## 2022-01-06 LAB — FERRITIN: Ferritin: 65 ng/mL (ref 11–307)

## 2022-01-06 NOTE — Progress Notes (Signed)
?[place] ?[phone]   [phone] ? ?PROGRESS NOTE ? ?Patient Care Team: ?[place]. as PCP - General ?Orson Slick, MD as Consulting Physician (Hematology and Oncology) ? ?Hematological/Oncological History ?# Iron Deficiency Anemia 2/2 to GYN Bleeding ?01/03/2020: WBC 9.0, Hgb 14.2, MCV 101.4, Plt 309 ?06/12/2021: WBC 8.9, Hgb 7.8, MCV 73, Plt 416. Ferritin 9  ?07/04/2021: establish care with Dr. [name]  ?08/11/2021-09/09/2021: 6 x does of IV iron sucrose 200 mg  ?10/09/2021: Hgb 9.7, Sat ratio 8%, Ferritin 27 ?01/06/2022: Hgb 12.0, iron 50, TIBC 412, Sat 12%, Ferritin 65 ?11/11/2021-12/09/2021: 5x does of IV iron sucrose 200 mg  ? ?Interval History:  ?Yvonne [name] 36 y.o. female with medical history significant for iron deficiency secondary to GYN bleeding who presents for a follow up visit. The patient's last visit was on 10/09/2021. In the interim since the last visit received another 5 doses of IV iron sucrose. ? ?On exam today Yvonne [name] reports he continues to have some pain in the pelvis but fortunately is scheduled for surgery on 02/14/2022.  She reports that after her last round of iron she "feels good".  She notes that she is still taking the iron pills.  She notes that she does occasionally have episodes of shortness of breath on exertion.  She notes that she has not been having any heavy menstrual bleeding since she received the Depo shot in January.  She notes that her ice cravings have decreased markedly.  She is not having any bleeding from any other sources.  She otherwise denies any overt sources of bleeding.  She denies any fevers, chills, sweats, nausea, vomiting or diarrhea.  Full 10 point ROS is listed below. ? ?MEDICAL HISTORY:  ?Past Medical History:  ?Diagnosis Date  ? Anemia   ? Fibroids   ? Obesity, Class III, BMI 40-49.9 (morbid obesity) ([place])   ? BMI 40  ? Recurrent UTI (urinary tract infection)   ? Shortness of breath   ? "sometimes  when I have a cold"  ? Spasm of lung air passages   ? ? ?SURGICAL HISTORY: ?Past Surgical History:  ?Procedure Laterality Date  ? left arm surgey    ? ROBOT ASSISTED MYOMECTOMY  07/12/2012  ? Procedure: ROBOTIC ASSISTED MYOMECTOMY;  Surgeon: Lavonia Drafts, MD;  Location: [facility];  Service: Gynecology;  Laterality: N/A;  ? THERAPEUTIC ABORTION    ? ? ?SOCIAL HISTORY: ?Social History  ? ?Socioeconomic History  ? Marital status: Married  ?  Spouse name: Not on file  ? Number of children: Not on file  ? Years of education: Not on file  ? Highest education level: Not on file  ?Occupational History  ? Not on file  ?Tobacco Use  ? Smoking status: Former  ?  Packs/day: 0.25  ?  Types: Cigarettes  ?  Quit date: 04/26/2019  ?  Years since quitting: 2.7  ? Smokeless tobacco: Never  ?Vaping Use  ? Vaping Use: Never used  ?Substance and Sexual Activity  ? Alcohol use: Not Currently  ?  Comment: occasionally  ? Drug use: Not Currently  ?  Types: Marijuana  ?  Comment: patient states, "once in a while per month"  ? Sexual activity: Yes  ?  Birth control/protection: None, Pill  ?Other Topics Concern  ? Not on file  ?Social History Narrative  ? Not on file  ? ?Social Determinants of Health  ? ?Financial Resource Strain: Not on file  ?  Food Insecurity: Not on file  ?Transportation Needs: Not on file  ?Physical Activity: Not on file  ?Stress: Not on file  ?Social Connections: Not on file  ?Intimate Partner Violence: Not on file  ? ? ?FAMILY HISTORY: ?Family History  ?Problem Relation Age of Onset  ? Fibroids Mother   ? Hypertension Mother   ? ? ?ALLERGIES:  is allergic to blue dyes (parenteral). ? ?MEDICATIONS:  ?Current Outpatient Medications  ?Medication Sig Dispense Refill  ? albuterol (VENTOLIN HFA) 108 (90 Base) MCG/ACT inhaler 1 puff as needed    ? azelastine (ASTELIN) 0.1 % nasal spray     ? cetirizine (ZYRTEC) 10 MG tablet Take 10 mg by mouth daily as needed for allergies.    ? cholecalciferol (VITAMIN D3) 25 MCG (1000  UNIT) tablet 1 tablet    ? ferrous sulfate 325 (65 FE) MG tablet Take 650 mg by mouth daily with breakfast.    ? fluticasone (FLONASE) 50 MCG/ACT nasal spray Place 2 sprays into both nostrils daily as needed for allergies or rhinitis.    ? magnesium oxide (MAG-OX) 400 MG tablet Take by mouth.    ? metoprolol tartrate (LOPRESSOR) 25 MG tablet Take by mouth.    ? Misc. Devices MISC Initiate CPAP therapy at 9 cm. water pressure for OSA.  ResMed AirFit P10 (S) nasal pillow mask or patient preference.  Send to [facility] in [place].    ? ondansetron (ZOFRAN) 8 MG tablet Take 1 tablet (8 mg total) by mouth every 8 (eight) hours as needed for nausea or vomiting. 20 tablet 0  ? SYNTHROID 175 MCG tablet Take 175 mcg by mouth daily.    ? traMADol (ULTRAM) 50 MG tablet Take by mouth every 6 (six) hours as needed.    ? traZODone (DESYREL) 50 MG tablet 1 tablet at bedtime as needed    ? valACYclovir (VALTREX) 500 MG tablet Take 500 mg by mouth daily as needed (outbreaks).    ? ?No current facility-administered medications for this visit.  ? ? ?REVIEW OF SYSTEMS:   ?Constitutional: ( - ) fevers, ( - )  chills , ( - ) night sweats ?Eyes: ( - ) blurriness of vision, ( - ) double vision, ( - ) watery eyes ?Ears, nose, mouth, throat, and face: ( - ) mucositis, ( - ) sore throat ?Respiratory: ( - ) cough, ( - ) dyspnea, ( - ) wheezes ?Cardiovascular: ( - ) palpitation, ( - ) chest discomfort, ( - ) lower extremity swelling ?Gastrointestinal:  ( - ) nausea, ( - ) heartburn, ( - ) change in bowel habits ?Skin: ( - ) abnormal skin rashes ?Lymphatics: ( - ) new lymphadenopathy, ( - ) easy bruising ?Neurological: ( - ) numbness, ( - ) tingling, ( - ) new weaknesses ?Behavioral/Psych: ( - ) mood change, ( - ) new changes  ?All other systems were reviewed with the patient and are negative. ? ?PHYSICAL EXAMINATION: ?ECOG PERFORMANCE STATUS: 1 - Symptomatic but completely ambulatory ? ?Vitals:  ? 01/06/22 1000  ?BP: 119/77  ?Pulse: 64  ?Resp:  16  ?Temp: 97.7 ?F (36.5 ?C)  ?SpO2: 100%  ? ?Filed Weights  ? 01/06/22 1000  ?Weight: (!) 319 lb (144.7 kg)  ? ? ?GENERAL: Well-appearing middle-aged African-American female, alert, no distress and comfortable ?SKIN: skin color, texture, turgor are normal, no rashes or significant lesions ?EYES: conjunctiva are pink and non-injected, sclera clear ?LUNGS: clear to auscultation and percussion with normal breathing effort ?HEART: regular rate &  rhythm and no murmurs and no lower extremity edema ?Musculoskeletal: no cyanosis of digits and no clubbing  ?PSYCH: alert & oriented x 3, fluent speech ?NEURO: no focal motor/sensory deficits ? ?LABORATORY DATA:  ?I have reviewed the data as listed ? ?  Latest Ref Rng & Units 01/06/2022  ?  9:39 AM 10/09/2021  ? 10:56 AM 08/19/2021  ?  1:53 PM  ?CBC  ?WBC 4.0 - 10.5 K/uL 7.3   9.6   9.0    ?Hemoglobin 12.0 - 15.0 g/dL 12.0   9.7   10.7    ?Hematocrit 36.0 - 46.0 % 39.2   32.4   37.0    ?Platelets 150 - 400 K/uL 296   338   360    ? ? ? ?  Latest Ref Rng & Units 01/06/2022  ?  9:39 AM 10/09/2021  ? 10:56 AM 08/19/2021  ?  1:53 PM  ?CMP  ?Glucose 70 - 99 mg/dL 101   96   97    ?BUN 6 - 20 mg/dL '13   13   11    '$ ?Creatinine 0.44 - 1.00 mg/dL 0.84   0.84   0.93    ?Sodium 135 - 145 mmol/L 139   134   137    ?Potassium 3.5 - 5.1 mmol/L 4.0   3.8   4.5    ?Chloride 98 - 111 mmol/L 106   105   105    ?CO2 22 - 32 mmol/L '27   22   26    '$ ?Calcium 8.9 - 10.3 mg/dL 9.0   8.8   9.2    ?Total Protein 6.5 - 8.1 g/dL 6.9   6.6   7.5    ?Total Bilirubin 0.3 - 1.2 mg/dL 0.4   0.6   0.5    ?Alkaline Phos 38 - 126 U/L 70   58   70    ?AST 15 - 41 U/L '14   11   16    '$ ?ALT 0 - 44 U/L '13   11   20    '$ ? ? ? ?RADIOGRAPHIC STUDIES: ?No results found. ? ?ASSESSMENT & PLAN ?Yvonne [name] 36 y.o. female with medical history significant for iron deficiency secondary to GYN bleeding who presents for a follow up visit. ?  ?After review of the labs, review of the records, and discussion with the patient the  patients findings are most consistent with iron deficiency anemia secondary to heavy GYN bleeding from her fibroids.  At this time treatment of iron deficiency anemia is a two-pronged attack.  First be to

## 2022-01-13 ENCOUNTER — Encounter: Payer: Self-pay | Admitting: Hematology and Oncology

## 2022-01-27 ENCOUNTER — Telehealth: Payer: Self-pay | Admitting: *Deleted

## 2022-01-27 ENCOUNTER — Encounter: Payer: Self-pay | Admitting: *Deleted

## 2022-01-27 NOTE — Telephone Encounter (Signed)
FMLA form faxed from Shelbie Proctor requesting renewal of FMLA for appointments for presurgical testing and surgery on Feb 14, 2022.  Noted pending encounter for Hysterectomy procedure with Dr. Servando Salina. ? ?Connected with Shelbie Proctor [phone]).  [facility] office unable to certify leave for surgical pre-testing or surgery.  Advised send request to surgeon.  Provided surgeons name per hearing "I do not know who surgeon is". ? ?"I may need iron after surgery.  FMLA per Dr. [name] expired, effective 01/24/2022." ?  ?03/17/2022 is her next scheduled F/U with Dr. [name].  Will complete FMLA per protocol and e-mail ROI and cover sheet to [email or web address].      ?

## 2022-01-29 ENCOUNTER — Other Ambulatory Visit: Payer: Self-pay | Admitting: Pharmacy Technician

## 2022-02-05 ENCOUNTER — Other Ambulatory Visit: Payer: Self-pay

## 2022-02-05 ENCOUNTER — Encounter (HOSPITAL_BASED_OUTPATIENT_CLINIC_OR_DEPARTMENT_OTHER): Payer: Self-pay | Admitting: Obstetrics and Gynecology

## 2022-02-05 ENCOUNTER — Telehealth: Payer: Self-pay

## 2022-02-05 DIAGNOSIS — Z8669 Personal history of other diseases of the nervous system and sense organs: Secondary | ICD-10-CM

## 2022-02-05 DIAGNOSIS — D649 Anemia, unspecified: Secondary | ICD-10-CM

## 2022-02-05 HISTORY — DX: Anemia, unspecified: D64.9

## 2022-02-05 HISTORY — DX: Personal history of other diseases of the nervous system and sense organs: Z86.69

## 2022-02-05 NOTE — Progress Notes (Signed)
? ? Your procedure is scheduled on Friday, 02/14/2022. ? Report to [place] ? Call this number if you have problems the morning of surgery  :[phone]. ? ? [facility].  WE ARE LOCATED IN [facility]. ? ?PLEASE BRING YOUR INSURANCE CARD AND PHOTO ID DAY OF SURGERY. ? ?ONLY 2 PEOPLE ARE ALLOWED IN  WAITING  ROOM.  ?                                   ? REMEMBER: ? DO NOT EAT FOOD, CANDY GUM OR MINTS  AFTER MIDNIGHT THE NIGHT BEFORE YOUR SURGERY . YOU MAY HAVE CLEAR LIQUIDS FROM MIDNIGHT THE NIGHT BEFORE YOUR SURGERY UNTIL 4:30 am. NO CLEAR LIQUIDS AFTER   4:30 am DAY OF SURGERY. ? ?YOU MAY  BRUSH YOUR TEETH MORNING OF SURGERY AND RINSE YOUR MOUTH OUT, NO CHEWING GUM CANDY OR MINTS. ? ? ? ? ?CLEAR LIQUID DIET ? ? ?Foods Allowed                                                                     Foods Excluded ? ?Coffee and tea, regular and decaf                             liquids that you cannot  ?Plain Jell-O                                                                   see through such as: ?Fruit ices (not with fruit pulp)                                     milk, soups, orange juice  ?Plain  Popsicles                                    All solid food ?Carbonated beverages, regular and diet                                    ?Cranberry, grape and apple juices ?Sports drinks like Gatorade ? ?____________________________________________________________________ ?  ? ? TAKE THESE MEDICATIONS MORNING OF SURGERY: Albuterol inhaler, Astelin nasal spray, Zyrtec, Synthroid, Zofran if needed, Cardizem if needed, Tramadol if needed, Valtrex if needed ? ?PLEASE BRING INHALER WITH YOU ON THE DAY OF SURGERY. ? ?PLEASE BRING CPAP AND TUBING WITH YOU ON THE DAY OF SURGERY. YOU MAY LEAVE IT IN THE CAR IN CASE IT IS NEEDED DURING POST-OP. ? ? ? UP TO 4 VISITORS  MAY VISIT IN THE EXTENDED RECOVERY ROOM UNTIL 800 PM ONLY.  1 VISITOR AGE 81 AND OVER MAY  SPEND THE NIGHT AND MUST BE IN EXTENDED RECOVERY ROOM NO LATER THAN 800 PM . YOUR DISCHARGE TIME AFTER YOU SPEND THE NIGHT IS 900 AM THE MORNING AFTER YOUR SURGERY. ? ?YOU MAY PACK A SMALL OVERNIGHT BAG WITH TOILETRIES FOR YOUR OVERNIGHT STAY IF YOU WISH. ? ?YOUR PRESCRIPTION MEDICATIONS WILL BE PROVIDED DURING [facility]. ? ? ?                                   ?DO NOT WEAR JEWERLY, MAKE UP. ?DO NOT WEAR LOTIONS, POWDERS, PERFUMES OR NAIL POLISH ON YOUR FINGERNAILS. TOENAIL POLISH IS OK TO WEAR. ?DO NOT SHAVE FOR 48 HOURS PRIOR TO DAY OF SURGERY. ?MEN MAY SHAVE FACE AND NECK. ?CONTACTS, GLASSES, OR DENTURES MAY NOT BE WORN TO SURGERY. ? ?REMEMBER: NO SMOKING, DRUGS OR ALCOHOL FOR 24 HOURS BEFORE YOUR SURGERY. ?                                   ?[facility] IS NOT RESPONSIBLE  FOR ANY BELONGINGS.                                  ?                                  . ?          [facility] - Preparing for Surgery ?Before surgery, you can play an important role.  Because skin is not sterile, your skin needs to be as free of germs as possible.  You can reduce the number of germs on your skin by washing with CHG (chlorahexidine gluconate) soap before surgery.  CHG is an antiseptic cleaner which kills germs and bonds with the skin to continue killing germs even after washing. ?Please DO NOT use if you have an allergy to CHG or antibacterial soaps.  If your skin becomes reddened/irritated stop using the CHG and inform your nurse when you arrive at Short Stay. ?Do not shave (including legs and underarms) for at least 48 hours prior to the first CHG shower.  You may shave your face/neck. ?Please follow these instructions carefully: ? 1.  Shower with CHG Soap the night before surgery and the  morning of Surgery. ? 2.  If you choose to wash your hair, wash your hair first as usual with your  normal  shampoo. ? 3.  After you shampoo, rinse your hair and body thoroughly to remove the  shampoo.                             ?4.  Use CHG as you would any other liquid soap.  You can apply chg directly  to the skin and wash  ?                    Gently with a scrungie or clean washcloth. ? 5.  Apply the CHG Soap to your body ONLY FROM THE NECK DOWN.   Do not use on face/ open      ?  Wound or open sores. Avoid contact with eyes, ears mouth and genitals (private parts).  ?                     Production manager,  Genitals (private parts) with your normal soap. ?            6.  Wash thoroughly, paying special attention to the area where your surgery  will be performed. ? 7.  Thoroughly rinse your body with warm water from the neck down. ? 8.  DO NOT shower/wash with your normal soap after using and rinsing off  the CHG Soap. ?               9.  Pat yourself dry with a clean towel. ?           10.  Wear clean pajamas. ?           11.  Place clean sheets on your bed the night of your first shower and do not  sleep with pets. ?Day of Surgery : ?Do not apply any lotions/deodorants the morning of surgery.  Please wear clean clothes to the hospital/surgery center. ? ?IF YOU HAVE ANY SKIN IRRITATION OR PROBLEMS WITH THE SURGICAL SOAP, PLEASE GET A BAR OF GOLD DIAL SOAP AND SHOWER THE NIGHT BEFORE YOUR SURGERY AND THE MORNING OF YOUR SURGERY. PLEASE LET THE NURSE KNOW MORNING OF YOUR SURGERY IF YOU HAD ANY PROBLEMS WITH THE SURGICAL SOAP. ? ? ?________________________________________________________________________                  ?                                    ?  QUESTIONS CALL Amaani Guilbault PRE OP NURSE PHONE [phone].                                    ?

## 2022-02-05 NOTE — Telephone Encounter (Signed)
Spoke with Patient regarding FMLA paperwork. Patient has upcoming surgery in May and her GYN Provider is completing Continuous Leave paperwork for her. Next scheduled appointment with Dr. [name] is 03/17/22. Advised Patient to call back at that time for completion of Intermittent Leave if needed for follow up appointments, labs, and Iron infusions. Understanding verbalized. No other needs or concerns voiced at this time. ?

## 2022-02-05 NOTE — Progress Notes (Addendum)
Spoke w/ via phone for pre-op interview---Yoshi ?Lab needs dos---- urine pregnancy per anesthesia, surgeon orders pending as of 02/05/2022              ?Lab results------02/11/2022 lab appt for CBC, type & screen, 11/01/21 EKG in chart, 12/13/2021 US thyroid in [place], see recent cardiac test results below ?COVID test -----patient states asymptomatic no test needed ?Arrive at -------0530 on Friday, 02/14/2022 ?NPO after MN NO Solid Food.  Clear liquids from MN until---0430 ?Med rec completed ?Medications to take morning of surgery -----Albuterol inhaler, Astelin nasal spray, Zyrtec, Synthroid, Cardizem prn, Zofran prn, Tramadol prn, Valtrex prn ?Diabetic medication -----n/a ?Patient instructed no nail polish to be worn day of surgery ?Patient instructed to bring photo id and insurance card day of surgery ?Patient aware to have Driver (ride ) / caregiver    for 24 hours after surgery - husband, Toni Arthurs ?Patient Special Instructions -----Bring inhaler and CPAP day of surgery. ?Pre-Op special Istructions -----Requested orders from Dr. Garwin Brothers via Epic IB on 02/05/22. ?Patient verbalized understanding of instructions that were given at this phone interview. ?Patient denies chest pain, fever, cough at this phone interview.  ? ?Patient states that she is experiencing some difficulty swallowing due to a 2cm thyroid nodule found on 12/13/21 Thyroid US in [place]. She states that she can eat and drink normally, but it sometimes bothers her at night. Patient has a f/u appointment with [facility] on 02/10/2022 per pt. ? ?Patient does endorse mild SOB w/ exertion if allergies are flared up or she is having heavy menstrual bleeding. Patient uses an albuterol inhaler prn for asthma and has been receiving iron infusions (last infusion 12/09/21)  and taking iron supplements for iron-deficiency anemia. ? ?Patient follows with [facility] in [place], [place] 11/01/2021 with Dr. Geanie Berlin in [place].She has a history of mild  cardiomegaly,  an episode of 9 beats of SVT on heart monitor 07/2020, palpitations w/mild chest discomfort and mild DOE during menorrhagia, hx of smoking (quit 2020), current daily marijuana use, iron deficiency anemia & OSA with CPAP compliance. ?10/01/2021 Echo stress in [place] - normal ?10/30/2021 Cardiac MRI in Care Everywhere - mildly reduced LVEF of 48% w/mild global hypokineses ?11/01/21 EKG in chart - sinus bradycardia ?12/02/2021 Long term monitor results in Care Everywhere - No SVT, no A-fib, no VT, no long pauses of 3 seconds or more noted. ?02/05/2022 Cardiac clearance from Dr. Julien Nordmann in chart. In clearance note, Dr. Julien Nordmann gave instructions for taking Metoprolol I called his nurse Otila Back, RN because the patient no longer takes Metoprolol. She said that Metoprolol had been d/c'd and Cardizem was started. Per Raquel Sarna, RN, patient should continue Cardizem as prescribed. ?

## 2022-02-07 NOTE — Progress Notes (Signed)
Received cardiac clearance from Dr. Julien Nordmann on 02/06/2022. Placed in patient's chart for upcoming 02/14/22 surgery. ? ?

## 2022-02-10 ENCOUNTER — Other Ambulatory Visit: Payer: Self-pay | Admitting: Obstetrics and Gynecology

## 2022-02-11 ENCOUNTER — Encounter (HOSPITAL_COMMUNITY)
Admission: RE | Admit: 2022-02-11 | Discharge: 2022-02-11 | Disposition: A | Discharge status: Home / Self Care | Payer: Managed Care, Other (non HMO) | Source: Ambulatory Visit | Attending: Obstetrics and Gynecology | Admitting: Obstetrics and Gynecology

## 2022-02-11 DIAGNOSIS — Z01818 Encounter for other preprocedural examination: Secondary | ICD-10-CM

## 2022-02-11 DIAGNOSIS — Z01812 Encounter for preprocedural laboratory examination: Secondary | ICD-10-CM | POA: Diagnosis not present

## 2022-02-11 LAB — BASIC METABOLIC PANEL
Anion gap: 5 (ref 5–15)
BUN: 11 mg/dL (ref 6–20)
CO2: 27 mmol/L (ref 22–32)
Calcium: 9 mg/dL (ref 8.9–10.3)
Chloride: 105 mmol/L (ref 98–111)
Creatinine, Ser: 0.88 mg/dL (ref 0.44–1.00)
GFR, Estimated: >60 mL/min (ref >60)
Glucose, Bld: 94 mg/dL (ref 70–99)
Potassium: 4 mmol/L (ref 3.5–5.1)
Sodium: 137 mmol/L (ref 135–145)

## 2022-02-11 LAB — CBC
HCT: 41.3 % (ref 36.0–46.0)
Hemoglobin: 12.6 g/dL (ref 12.0–15.0)
MCH: 27.8 pg (ref 26.0–34.0)
MCHC: 30.5 g/dL (ref 30.0–36.0)
MCV: 91 fL (ref 80.0–100.0)
Platelets: 280 10*3/uL (ref 150–400)
RBC: 4.54 MIL/uL (ref 3.87–5.11)
RDW: 15.2 % (ref 11.5–15.5)
WBC: 6.8 10*3/uL (ref 4.0–10.5)
nRBC: 0 % (ref 0.0–0.2)

## 2022-02-11 NOTE — Progress Notes (Signed)
Received call from [facility] lab , this pt had T&S done and her antibody positive .  Pt will need T&S done pre-op dos,  ordered entered. ?

## 2022-02-13 NOTE — Anesthesia Preprocedure Evaluation (Addendum)
Anesthesia Evaluation  ?Patient identified by MRN, date of birth, ID band ?Patient awake ? ? ? ?Reviewed: ?Allergy & Precautions, H&P , NPO status , Patient's Chart, lab work & pertinent test results ? ?History of Anesthesia Complications ?(+) PONV and history of anesthetic complications ? ?Airway ?Mallampati: II ? ?TM Distance: >3 FB ? ? ? ? Dental ?no notable dental hx. ?(+) Teeth Intact, Dental Advisory Given ?  ?Pulmonary ?shortness of breath and with exertion, sleep apnea and Continuous Positive Airway Pressure Ventilation , former smoker,  ?  ?Pulmonary exam normal ?breath sounds clear to auscultation ? ? ? ? ? ? Cardiovascular ?negative cardio ROS ?Normal cardiovascular exam ?Rhythm:Regular Rate:Normal ? ? ?  ?Neuro/Psych ? Headaches, PSYCHIATRIC DISORDERS Anxiety Depression negative neurological ROS ? negative psych ROS  ? GI/Hepatic ?negative GI ROS, Neg liver ROS,   ?Endo/Other  ?negative endocrine ROSMorbid obesity ? Renal/GU ?negative Renal ROS  ?negative genitourinary ?  ?Musculoskeletal ?negative musculoskeletal ROS ?(+)  ? Abdominal ?(+) + obese,   ?Peds ?negative pediatric ROS ?(+)  Hematology ?negative hematology ROS ?(+) Blood dyscrasia, anemia ,   ?Anesthesia Other Findings ? ? Reproductive/Obstetrics ?negative OB ROS ?DUB ?Uterine Fibroids ? ?  ? ? ? ? ? ? ? ? ? ? ? ? ? ?  ?  ? ? ? ? ? ?Anesthesia Physical ? ?Anesthesia Plan ? ?ASA: 3 ? ?Anesthesia Plan: General  ? ?Post-op Pain Management: Tylenol PO (pre-op)* and Celebrex PO (pre-op)*  ? ?Induction: Intravenous ? ?PONV Risk Score and Plan: 3 and Ondansetron, Dexamethasone and Treatment may vary due to age or medical condition ? ?Airway Management Planned: Oral ETT ? ?Additional Equipment: None ? ?Intra-op Plan:  ? ?Post-operative Plan: Extubation in OR ? ?Informed Consent: I have reviewed the patients History and Physical, chart, labs and discussed the procedure including the risks, benefits and alternatives  for the proposed anesthesia with the patient or authorized representative who has indicated his/her understanding and acceptance.  ? ? ? ?Dental advisory given ? ?Plan Discussed with: CRNA and Anesthesiologist ? ?Anesthesia Plan Comments:   ? ? ? ? ? ? ?Anesthesia Quick Evaluation ? ?

## 2022-02-14 ENCOUNTER — Ambulatory Visit (HOSPITAL_BASED_OUTPATIENT_CLINIC_OR_DEPARTMENT_OTHER)
Admission: RE | Admit: 2022-02-14 | Discharge: 2022-02-15 | Disposition: A | Discharge status: Home / Self Care | Payer: Managed Care, Other (non HMO) | Source: Ambulatory Visit | Attending: Obstetrics and Gynecology | Admitting: Obstetrics and Gynecology

## 2022-02-14 ENCOUNTER — Encounter (HOSPITAL_BASED_OUTPATIENT_CLINIC_OR_DEPARTMENT_OTHER)
Admission: RE | Disposition: A | Discharge status: Home / Self Care | Payer: Self-pay | Source: Ambulatory Visit | Attending: Obstetrics and Gynecology

## 2022-02-14 ENCOUNTER — Other Ambulatory Visit: Payer: Self-pay

## 2022-02-14 ENCOUNTER — Ambulatory Visit (HOSPITAL_BASED_OUTPATIENT_CLINIC_OR_DEPARTMENT_OTHER): Payer: Managed Care, Other (non HMO) | Admitting: Anesthesiology

## 2022-02-14 ENCOUNTER — Encounter (HOSPITAL_BASED_OUTPATIENT_CLINIC_OR_DEPARTMENT_OTHER): Payer: Self-pay | Admitting: Obstetrics and Gynecology

## 2022-02-14 DIAGNOSIS — N920 Excessive and frequent menstruation with regular cycle: Secondary | ICD-10-CM | POA: Diagnosis present

## 2022-02-14 DIAGNOSIS — E860 Dehydration: Secondary | ICD-10-CM | POA: Insufficient documentation

## 2022-02-14 DIAGNOSIS — Z6841 Body Mass Index (BMI) 40.0 and over, adult: Secondary | ICD-10-CM | POA: Insufficient documentation

## 2022-02-14 DIAGNOSIS — D259 Leiomyoma of uterus, unspecified: Secondary | ICD-10-CM | POA: Diagnosis present

## 2022-02-14 DIAGNOSIS — D509 Iron deficiency anemia, unspecified: Secondary | ICD-10-CM | POA: Insufficient documentation

## 2022-02-14 DIAGNOSIS — Z87891 Personal history of nicotine dependence: Secondary | ICD-10-CM | POA: Diagnosis not present

## 2022-02-14 DIAGNOSIS — N736 Female pelvic peritoneal adhesions (postinfective): Secondary | ICD-10-CM

## 2022-02-14 DIAGNOSIS — D250 Submucous leiomyoma of uterus: Secondary | ICD-10-CM | POA: Insufficient documentation

## 2022-02-14 DIAGNOSIS — N80101 Endometriosis of right ovary, unspecified depth: Secondary | ICD-10-CM

## 2022-02-14 DIAGNOSIS — N8003 Adenomyosis of the uterus: Secondary | ICD-10-CM | POA: Insufficient documentation

## 2022-02-14 DIAGNOSIS — Z01818 Encounter for other preprocedural examination: Secondary | ICD-10-CM

## 2022-02-14 DIAGNOSIS — N80121 Deep endometriosis of right ovary: Secondary | ICD-10-CM | POA: Insufficient documentation

## 2022-02-14 DIAGNOSIS — D251 Intramural leiomyoma of uterus: Secondary | ICD-10-CM | POA: Insufficient documentation

## 2022-02-14 DIAGNOSIS — M25519 Pain in unspecified shoulder: Secondary | ICD-10-CM | POA: Diagnosis not present

## 2022-02-14 DIAGNOSIS — Z90710 Acquired absence of both cervix and uterus: Secondary | ICD-10-CM | POA: Diagnosis present

## 2022-02-14 HISTORY — DX: Hyperlipidemia, unspecified: E78.5

## 2022-02-14 HISTORY — DX: Respiratory tuberculosis unspecified: A15.9

## 2022-02-14 HISTORY — DX: Presence of spectacles and contact lenses: Z97.3

## 2022-02-14 HISTORY — PX: ROBOTIC ASSISTED LAPAROSCOPIC HYSTERECTOMY AND SALPINGECTOMY: SHX6379

## 2022-02-14 HISTORY — DX: Unspecified rotator cuff tear or rupture of unspecified shoulder, not specified as traumatic: M75.100

## 2022-02-14 HISTORY — DX: Obstructive sleep apnea (adult) (pediatric): G47.33

## 2022-02-14 HISTORY — DX: Unspecified asthma, uncomplicated: J45.909

## 2022-02-14 HISTORY — DX: Abnormal uterine and vaginal bleeding, unspecified: N93.9

## 2022-02-14 HISTORY — PX: CYSTOSCOPY: SHX5120

## 2022-02-14 HISTORY — DX: Nontoxic single thyroid nodule: E04.1

## 2022-02-14 HISTORY — DX: Other specified postprocedural states: Z98.890

## 2022-02-14 HISTORY — DX: Other specified postprocedural states: R11.2

## 2022-02-14 LAB — TYPE AND SCREEN
ABO/RH(D): A POS
ABO/RH(D): A POS
Antibody Screen: POSITIVE
Antibody Screen: POSITIVE
Unit division: 0
Unit division: 0

## 2022-02-14 LAB — BPAM RBC
Blood Product Expiration Date: 202305232359
Blood Product Expiration Date: 202305232359
Unit Type and Rh: 6200
Unit Type and Rh: 6200

## 2022-02-14 LAB — POCT PREGNANCY, URINE: Preg Test, Ur: NEGATIVE

## 2022-02-14 SURGERY — XI ROBOTIC ASSISTED LAPAROSCOPIC HYSTERECTOMY AND SALPINGECTOMY
Anesthesia: General

## 2022-02-14 MED ORDER — ONDANSETRON HCL 4 MG/2ML IJ SOLN
INTRAMUSCULAR | Status: DC | PRN
  Administered 2022-02-14: 4 mg via INTRAVENOUS

## 2022-02-14 MED ORDER — DEXTROSE IN LACTATED RINGERS 5 % IV SOLN: INTRAVENOUS | Status: DC

## 2022-02-14 MED ORDER — PHENAZOPYRIDINE HCL 100 MG PO TABS
200.0000 mg | ORAL_TABLET | Freq: Once | ORAL | Status: AC
  Administered 2022-02-14: 200 mg via ORAL

## 2022-02-14 MED ORDER — OXYCODONE HCL 5 MG PO TABS: 5.0000 mg | ORAL_TABLET | Freq: Once | ORAL | Status: DC | PRN

## 2022-02-14 MED ORDER — IBUPROFEN 200 MG PO TABS: 600.0000 mg | ORAL_TABLET | Freq: Four times a day (QID) | ORAL | Status: DC

## 2022-02-14 MED ORDER — PHENAZOPYRIDINE HCL 100 MG PO TABS
ORAL_TABLET | ORAL | Status: AC
  Filled 2022-02-14: qty 2

## 2022-02-14 MED ORDER — LEVOTHYROXINE SODIUM 175 MCG PO TABS
175.0000 ug | ORAL_TABLET | Freq: Every day | ORAL | Status: DC
  Administered 2022-02-15: 175 ug via ORAL
  Filled 2022-02-14: qty 1

## 2022-02-14 MED ORDER — CEFAZOLIN IN SODIUM CHLORIDE 3-0.9 GM/100ML-% IV SOLN
3.0000 g | INTRAVENOUS | Status: AC
  Administered 2022-02-14: 3 g via INTRAVENOUS

## 2022-02-14 MED ORDER — GLYCOPYRROLATE 0.2 MG/ML IJ SOLN
INTRAMUSCULAR | Status: DC | PRN
Start: 2022-02-14 — End: 2022-02-14
  Administered 2022-02-14: .1 mg via INTRAVENOUS

## 2022-02-14 MED ORDER — ACETAMINOPHEN 160 MG/5ML PO SOLN: 325.0000 mg | ORAL | Status: DC | PRN

## 2022-02-14 MED ORDER — SIMETHICONE 80 MG PO CHEW
80.0000 mg | CHEWABLE_TABLET | Freq: Four times a day (QID) | ORAL | Status: DC | PRN
  Administered 2022-02-15: 80 mg via ORAL

## 2022-02-14 MED ORDER — ACETAMINOPHEN 325 MG PO TABS: 325.0000 mg | ORAL_TABLET | ORAL | Status: DC | PRN

## 2022-02-14 MED ORDER — KETOROLAC TROMETHAMINE 30 MG/ML IJ SOLN
INTRAMUSCULAR | Status: AC
  Filled 2022-02-14: qty 1

## 2022-02-14 MED ORDER — DILTIAZEM HCL 60 MG PO TABS: 120.0000 mg | ORAL_TABLET | Freq: Four times a day (QID) | ORAL | Status: DC

## 2022-02-14 MED ORDER — ONDANSETRON HCL 4 MG/2ML IJ SOLN: 4.0000 mg | Freq: Once | INTRAMUSCULAR | Status: DC | PRN

## 2022-02-14 MED ORDER — MIDAZOLAM HCL 2 MG/2ML IJ SOLN
INTRAMUSCULAR | Status: AC
Start: 2022-02-14 — End: ?
  Filled 2022-02-14: qty 2

## 2022-02-14 MED ORDER — OXYCODONE HCL 5 MG PO TABS
ORAL_TABLET | ORAL | Status: AC
  Filled 2022-02-14: qty 1

## 2022-02-14 MED ORDER — SODIUM CHLORIDE (PF) 0.9 % IJ SOLN
INTRAMUSCULAR | Status: AC
  Filled 2022-02-14: qty 20

## 2022-02-14 MED ORDER — LACTATED RINGERS IV SOLN: INTRAVENOUS | Status: DC

## 2022-02-14 MED ORDER — FENTANYL CITRATE (PF) 100 MCG/2ML IJ SOLN
INTRAMUSCULAR | Status: AC
Start: 2022-02-14 — End: ?
  Filled 2022-02-14: qty 2

## 2022-02-14 MED ORDER — OXYCODONE HCL 5 MG/5ML PO SOLN: 5.0000 mg | Freq: Once | ORAL | Status: DC | PRN

## 2022-02-14 MED ORDER — PANTOPRAZOLE SODIUM 40 MG PO TBEC
40.0000 mg | DELAYED_RELEASE_TABLET | Freq: Every day | ORAL | Status: DC
  Administered 2022-02-14 – 2022-02-15 (×2): 40 mg via ORAL

## 2022-02-14 MED ORDER — MENTHOL 3 MG MT LOZG
LOZENGE | OROMUCOSAL | Status: AC
  Filled 2022-02-14: qty 9

## 2022-02-14 MED ORDER — PANTOPRAZOLE SODIUM 40 MG PO TBEC
DELAYED_RELEASE_TABLET | ORAL | Status: AC
  Filled 2022-02-14: qty 1

## 2022-02-14 MED ORDER — ACETAMINOPHEN 500 MG PO TABS
ORAL_TABLET | ORAL | Status: AC
  Filled 2022-02-14: qty 1

## 2022-02-14 MED ORDER — CEFAZOLIN IN SODIUM CHLORIDE 3-0.9 GM/100ML-% IV SOLN
INTRAVENOUS | Status: AC
  Filled 2022-02-14: qty 100

## 2022-02-14 MED ORDER — ACETAMINOPHEN 500 MG PO TABS
ORAL_TABLET | ORAL | Status: AC
  Filled 2022-02-14: qty 2

## 2022-02-14 MED ORDER — SODIUM CHLORIDE 0.9 % IR SOLN
Status: DC | PRN
  Administered 2022-02-14: 1000 mL
  Administered 2022-02-14: 3000 mL via INTRAVESICAL
  Administered 2022-02-14 (×2): 1000 mL via INTRAVESICAL

## 2022-02-14 MED ORDER — OXYCODONE HCL 5 MG PO TABS
5.0000 mg | ORAL_TABLET | ORAL | Status: DC | PRN
  Administered 2022-02-14 (×2): 5 mg via ORAL
  Administered 2022-02-14 – 2022-02-15 (×4): 10 mg via ORAL

## 2022-02-14 MED ORDER — CELECOXIB 200 MG PO CAPS
ORAL_CAPSULE | ORAL | Status: AC
  Filled 2022-02-14: qty 1

## 2022-02-14 MED ORDER — ACETAMINOPHEN 500 MG PO TABS
1000.0000 mg | ORAL_TABLET | Freq: Four times a day (QID) | ORAL | Status: DC
  Administered 2022-02-14 – 2022-02-15 (×3): 1000 mg via ORAL

## 2022-02-14 MED ORDER — SUGAMMADEX SODIUM 200 MG/2ML IV SOLN
INTRAVENOUS | Status: DC | PRN
  Administered 2022-02-14: 300 mg via INTRAVENOUS

## 2022-02-14 MED ORDER — ALBUTEROL SULFATE HFA 108 (90 BASE) MCG/ACT IN AERS: 1.0000 | INHALATION_SPRAY | Freq: Four times a day (QID) | RESPIRATORY_TRACT | Status: DC | PRN

## 2022-02-14 MED ORDER — MENTHOL 3 MG MT LOZG
1.0000 | LOZENGE | OROMUCOSAL | Status: DC | PRN
  Administered 2022-02-14 – 2022-02-15 (×2): 3 mg via ORAL

## 2022-02-14 MED ORDER — ACETAMINOPHEN 500 MG PO TABS
1000.0000 mg | ORAL_TABLET | Freq: Once | ORAL | Status: AC
  Administered 2022-02-14: 1000 mg via ORAL

## 2022-02-14 MED ORDER — ONDANSETRON HCL 4 MG PO TABS: 4.0000 mg | ORAL_TABLET | Freq: Four times a day (QID) | ORAL | Status: DC | PRN

## 2022-02-14 MED ORDER — OXYCODONE HCL 5 MG PO TABS
ORAL_TABLET | ORAL | Status: AC
  Filled 2022-02-14: qty 2

## 2022-02-14 MED ORDER — BUPIVACAINE HCL (PF) 0.25 % IJ SOLN
INTRAMUSCULAR | Status: DC | PRN
  Administered 2022-02-14: 26 mL

## 2022-02-14 MED ORDER — POVIDONE-IODINE 10 % EX SWAB: 2.0000 "application " | Freq: Once | CUTANEOUS | Status: DC

## 2022-02-14 MED ORDER — PHENYLEPHRINE HCL (PRESSORS) 10 MG/ML IV SOLN
INTRAVENOUS | Status: DC | PRN
  Administered 2022-02-14: 160 ug via INTRAVENOUS

## 2022-02-14 MED ORDER — FENTANYL CITRATE (PF) 250 MCG/5ML IJ SOLN
INTRAMUSCULAR | Status: AC
  Filled 2022-02-14: qty 5

## 2022-02-14 MED ORDER — SODIUM CHLORIDE 0.9 % IV SOLN: INTRAVENOUS | Status: DC | PRN

## 2022-02-14 MED ORDER — ONDANSETRON HCL 4 MG/2ML IJ SOLN
4.0000 mg | Freq: Four times a day (QID) | INTRAMUSCULAR | Status: DC | PRN
  Administered 2022-02-15: 4 mg via INTRAVENOUS

## 2022-02-14 MED ORDER — MIDAZOLAM HCL 2 MG/2ML IJ SOLN
INTRAMUSCULAR | Status: DC | PRN
  Administered 2022-02-14: 2 mg via INTRAVENOUS

## 2022-02-14 MED ORDER — CELECOXIB 200 MG PO CAPS
200.0000 mg | ORAL_CAPSULE | Freq: Once | ORAL | Status: AC
  Administered 2022-02-14: 200 mg via ORAL

## 2022-02-14 MED ORDER — LIDOCAINE HCL (CARDIAC) PF 100 MG/5ML IV SOSY
PREFILLED_SYRINGE | INTRAVENOUS | Status: DC | PRN
Start: 2022-02-14 — End: 2022-02-14
  Administered 2022-02-14: 50 mg via INTRAVENOUS

## 2022-02-14 MED ORDER — MEPERIDINE HCL 25 MG/ML IJ SOLN: 6.2500 mg | INTRAMUSCULAR | Status: DC | PRN

## 2022-02-14 MED ORDER — DEXAMETHASONE SODIUM PHOSPHATE 4 MG/ML IJ SOLN
INTRAMUSCULAR | Status: DC | PRN
  Administered 2022-02-14: 8 mg via INTRAVENOUS

## 2022-02-14 MED ORDER — ROCURONIUM BROMIDE 100 MG/10ML IV SOLN
INTRAVENOUS | Status: DC | PRN
  Administered 2022-02-14 (×2): 10 mg via INTRAVENOUS
  Administered 2022-02-14: 100 mg via INTRAVENOUS
  Administered 2022-02-14 (×5): 10 mg via INTRAVENOUS
  Administered 2022-02-14: 20 mg via INTRAVENOUS
  Administered 2022-02-14: 10 mg via INTRAVENOUS

## 2022-02-14 MED ORDER — FENTANYL CITRATE (PF) 100 MCG/2ML IJ SOLN
INTRAMUSCULAR | Status: DC | PRN
  Administered 2022-02-14 (×2): 50 ug via INTRAVENOUS
  Administered 2022-02-14: 25 ug via INTRAVENOUS
  Administered 2022-02-14 (×2): 50 ug via INTRAVENOUS
  Administered 2022-02-14: 25 ug via INTRAVENOUS
  Administered 2022-02-14 (×2): 50 ug via INTRAVENOUS

## 2022-02-14 MED ORDER — FENTANYL CITRATE (PF) 100 MCG/2ML IJ SOLN: 25.0000 ug | INTRAMUSCULAR | Status: DC | PRN

## 2022-02-14 MED ORDER — PROPOFOL 10 MG/ML IV BOLUS
INTRAVENOUS | Status: DC | PRN
  Administered 2022-02-14: 30 mg via INTRAVENOUS
  Administered 2022-02-14: 250 mg via INTRAVENOUS
  Administered 2022-02-14: 40 mg via INTRAVENOUS
  Administered 2022-02-14: 50 mg via INTRAVENOUS

## 2022-02-14 MED ORDER — KETOROLAC TROMETHAMINE 30 MG/ML IJ SOLN
30.0000 mg | Freq: Four times a day (QID) | INTRAMUSCULAR | Status: DC
  Administered 2022-02-14 – 2022-02-15 (×3): 30 mg via INTRAVENOUS

## 2022-02-14 MED ORDER — HEMOSTATIC AGENTS (NO CHARGE) OPTIME
TOPICAL | Status: DC | PRN
  Administered 2022-02-14: 1

## 2022-02-14 SURGICAL SUPPLY — 72 items
ADH SKN CLS APL DERMABOND .7 (GAUZE/BANDAGES/DRESSINGS) ×1
APL SRG 38 LTWT LNG FL B (MISCELLANEOUS) ×1
APPLICATOR ARISTA FLEXITIP XL (MISCELLANEOUS) ×1 IMPLANT
BARRIER ADHS 3X4 INTERCEED (GAUZE/BANDAGES/DRESSINGS) IMPLANT
BLADE SURG 10 STRL SS (BLADE) ×1 IMPLANT
BRR ADH 4X3 ABS CNTRL BYND (GAUZE/BANDAGES/DRESSINGS)
CATH FOLEY 3WAY  5CC 16FR (CATHETERS) ×2
CATH FOLEY 3WAY 5CC 16FR (CATHETERS) ×1 IMPLANT
COVER BACK TABLE 60X90IN (DRAPES) ×2 IMPLANT
COVER TIP SHEARS 8 DVNC (MISCELLANEOUS) ×1 IMPLANT
COVER TIP SHEARS 8MM DA VINCI (MISCELLANEOUS) ×2
DECANTER SPIKE VIAL GLASS SM (MISCELLANEOUS) ×2 IMPLANT
DEFOGGER SCOPE WARMER CLEARIFY (MISCELLANEOUS) ×2 IMPLANT
DERMABOND ADVANCED (GAUZE/BANDAGES/DRESSINGS) ×1
DERMABOND ADVANCED .7 DNX12 (GAUZE/BANDAGES/DRESSINGS) ×1 IMPLANT
DRAPE ARM DVNC X/XI (DISPOSABLE) ×4 IMPLANT
DRAPE COLUMN DVNC XI (DISPOSABLE) ×1 IMPLANT
DRAPE DA VINCI XI ARM (DISPOSABLE) ×8
DRAPE DA VINCI XI COLUMN (DISPOSABLE) ×2
DRAPE SHEET LG 3/4 BI-LAMINATE (DRAPES) ×1 IMPLANT
DRAPE UTILITY XL STRL (DRAPES) ×2 IMPLANT
DURAPREP 26ML APPLICATOR (WOUND CARE) ×2 IMPLANT
ELECT REM PT RETURN 9FT ADLT (ELECTROSURGICAL) ×2
ELECTRODE REM PT RTRN 9FT ADLT (ELECTROSURGICAL) ×1 IMPLANT
GAUZE 4X4 16PLY ~~LOC~~+RFID DBL (SPONGE) ×4 IMPLANT
GLOVE BIOGEL PI IND STRL 7.0 (GLOVE) ×5 IMPLANT
GLOVE BIOGEL PI INDICATOR 7.0 (GLOVE) ×5
GLOVE ECLIPSE 6.5 STRL STRAW (GLOVE) ×6 IMPLANT
GLOVE SURG LTX SZ7 (GLOVE) ×1 IMPLANT
GOWN STRL REUS W/TWL LRG LVL3 (GOWN DISPOSABLE) ×1 IMPLANT
HEMOSTAT ARISTA ABSORB 3G PWDR (HEMOSTASIS) ×1 IMPLANT
HOLDER FOLEY CATH W/STRAP (MISCELLANEOUS) ×1 IMPLANT
IRRIG SUCT STRYKERFLOW 2 WTIP (MISCELLANEOUS) ×2
IRRIGATION SUCT STRKRFLW 2 WTP (MISCELLANEOUS) ×1 IMPLANT
KIT TURNOVER CYSTO (KITS) ×2 IMPLANT
LEGGING LITHOTOMY PAIR STRL (DRAPES) ×3 IMPLANT
LIGASURE VESSEL 5MM BLUNT TIP (ELECTROSURGICAL) ×1 IMPLANT
NEEDLE HYPO 22GX1.5 SAFETY (NEEDLE) ×1 IMPLANT
NEEDLE INSUFFLATION 120MM (ENDOMECHANICALS) ×2 IMPLANT
OBTURATOR OPTICAL STANDARD 8MM (TROCAR) ×2
OBTURATOR OPTICAL STND 8 DVNC (TROCAR) ×1
OBTURATOR OPTICALSTD 8 DVNC (TROCAR) ×1 IMPLANT
OCCLUDER COLPOPNEUMO (BALLOONS) ×2 IMPLANT
PACK ROBOT GYN CUSTOM WL (TRAY / TRAY PROCEDURE) ×1 IMPLANT
PACK ROBOTIC GOWN (GOWN DISPOSABLE) ×2 IMPLANT
PACK TRENDGUARD 450 HYBRID PRO (MISCELLANEOUS) ×1 IMPLANT
PAD OB MATERNITY 4.3X12.25 (PERSONAL CARE ITEMS) ×2 IMPLANT
PAD PREP 24X48 CUFFED NSTRL (MISCELLANEOUS) ×2 IMPLANT
PENCIL SMOKE EVAC W/HOLSTER (ELECTROSURGICAL) ×1 IMPLANT
PROTECTOR NERVE ULNAR (MISCELLANEOUS) ×4 IMPLANT
SCISSORS LAP 5X35 DISP (ENDOMECHANICALS) ×2 IMPLANT
SEAL CANN UNIV 5-8 DVNC XI (MISCELLANEOUS) ×4 IMPLANT
SEAL XI 5MM-8MM UNIVERSAL (MISCELLANEOUS) ×8
SEALER VESSEL DA VINCI XI (MISCELLANEOUS) ×2
SEALER VESSEL EXT DVNC XI (MISCELLANEOUS) ×1 IMPLANT
SET IRRIG Y TYPE TUR BLADDER L (SET/KITS/TRAYS/PACK) ×2 IMPLANT
SET TRI-LUMEN FLTR TB AIRSEAL (TUBING) ×2 IMPLANT
SPONGE T-LAP 4X18 ~~LOC~~+RFID (SPONGE) ×2 IMPLANT
SUT VIC AB 0 CT1 36 (SUTURE) ×4 IMPLANT
SUT VIC AB 0 SH 27 (SUTURE) ×1 IMPLANT
SUT VIC AB 4-0 PS2 18 (SUTURE) ×4 IMPLANT
SUT VLOC 180 0 9IN  GS21 (SUTURE) ×2
SUT VLOC 180 0 9IN GS21 (SUTURE) ×1 IMPLANT
TIP RUMI ORANGE 6.7MMX12CM (TIP) IMPLANT
TIP UTERINE 5.1X6CM LAV DISP (MISCELLANEOUS) IMPLANT
TIP UTERINE 6.7X10CM GRN DISP (MISCELLANEOUS) ×1 IMPLANT
TIP UTERINE 6.7X6CM WHT DISP (MISCELLANEOUS) IMPLANT
TIP UTERINE 6.7X8CM BLUE DISP (MISCELLANEOUS) IMPLANT
TOWEL OR 17X26 10 PK STRL BLUE (TOWEL DISPOSABLE) ×4 IMPLANT
TRENDGUARD 450 HYBRID PRO PACK (MISCELLANEOUS) ×2
TROCAR PORT AIRSEAL 8X120 (TROCAR) ×2 IMPLANT
WATER STERILE IRR 1000ML POUR (IV SOLUTION) ×2 IMPLANT

## 2022-02-14 NOTE — Brief Op Note (Signed)
02/14/2022 ? ?1:23 PM ? ?PATIENT:  Yvonne [name]  36 y.o. female ? ?PRE-OPERATIVE DIAGNOSIS:  menorrhagia with regular cycle ?uterine fibroid  ?Iron deficiency anemia ? ?POST-OPERATIVE DIAGNOSIS:  menorrhagia with regular cycle ?uterine fibroid, extensive abdomino-pelvic adhesions, and right ovarian endometrioma ?Iron deficiency anemia ? ?PROCEDURE:  Procedure(s): ?XI ROBOTIC ASSISTED LAPAROSCOPIC HYSTERECTOMY AND SALPINGECTOMY, LYSIS OF EXTENSIVE ADHESIONS, RIGHT OVARIAN CYSTECTOMY  (N/A) ?CYSTOSCOPY (N/A) ? ?SURGEON:  Surgeon(s) and Role: ?   Servando Salina, MD - Primary ? ?PHYSICIAN ASSISTANT:  ? ?ASSISTANTS: Gaylord Shih RNFA  ? ?ANESTHESIA:   general ? ?EBL:  50 mL  ?FINDINGS;  left anterior wall ventral hernia with omentum content, right ovary with endometrioma, extensive pelvic adhesions, bowel adhesions to left adnexa, right adnexal adhesions to uterus , uterus with multiple fibroids( subserosal, pedunculated, SM, intramural), nl liver edge ?BLOOD ADMINISTERED:none ? ?DRAINS: none  ? ?LOCAL MEDICATIONS USED:  MARCAINE    ? ?SPECIMEN:  Source of Specimen:  morcellated uterus, tubes, right ovarian cyst wall ? ?DISPOSITION OF SPECIMEN:  PATHOLOGY ? ?COUNTS:  YES ? ?TOURNIQUET:  * No tourniquets in log * ? ?DICTATION: .Other Dictation: Dictation Number [id number] ? ?PLAN OF CARE: Admit for overnight observation ? ?PATIENT DISPOSITION:  PACU - hemodynamically stable. ?  ?Delay start of Pharmacological VTE agent (>24hrs) due to surgical blood loss or risk of bleeding: no ? ?

## 2022-02-14 NOTE — Anesthesia Postprocedure Evaluation (Signed)
Anesthesia Post Note ? ?Patient: Yvonne [name] ? ?Procedure(s) Performed: XI ROBOTIC ASSISTED LAPAROSCOPIC HYSTERECTOMY AND SALPINGECTOMY, LYSIS OF EXTENSIVE ADHESIONS, RIGHT OVARIAN CYSTECTOMY  ?CYSTOSCOPY ? ?  ? ?Patient location during evaluation: PACU ?Anesthesia Type: General ?Level of consciousness: awake and alert ?Pain management: pain level controlled ?Vital Signs Assessment: post-procedure vital signs reviewed and stable ?Respiratory status: spontaneous breathing, nonlabored ventilation, respiratory function stable and patient connected to nasal cannula oxygen ?Cardiovascular status: blood pressure returned to baseline and stable ?Postop Assessment: no apparent nausea or vomiting ?Anesthetic complications: no ? ? ?No notable events documented. ? ?Last Vitals:  ?Vitals:  ? 02/14/22 1400 02/14/22 1415  ?BP: 120/75 130/82  ?Pulse: 77 76  ?Resp: (!) 22 18  ?Temp:  36.5 ?C  ?SpO2: 100% 96%  ?  ?Last Pain:  ?Vitals:  ? 02/14/22 1415  ?TempSrc:   ?PainSc: 4   ? ? ?  ?  ?  ?  ?  ?  ? ?Ishaan Villamar ? ? ? ? ?

## 2022-02-14 NOTE — Transfer of Care (Signed)
Immediate Anesthesia Transfer of Care Note ? ?Patient: Yvonne [name] ? ?Procedure(s) Performed: XI ROBOTIC ASSISTED LAPAROSCOPIC HYSTERECTOMY AND SALPINGECTOMY, LYSIS OF EXTENSIVE ADHESIONS, RIGHT OVARIAN CYSTECTOMY  ?CYSTOSCOPY ? ?Patient Location: PACU ? ?Anesthesia Type:General ? ?Level of Consciousness: awake, alert , oriented and patient cooperative ? ?Airway & Oxygen Therapy: Patient Spontanous Breathing and Patient connected to face mask oxygen ? ?Post-op Assessment: Report given to RN and Post -op Vital signs reviewed and stable ? ?Post vital signs: Reviewed and stable ? ?Last Vitals:  ?Vitals Value Taken Time  ?BP 126/78 02/14/22 1312  ?Temp    ?Pulse 81 02/14/22 1315  ?Resp 22 02/14/22 1315  ?SpO2 100 % 02/14/22 1315  ?Vitals shown include unvalidated device data. ? ?Last Pain:  ?Vitals:  ? 02/14/22 0630  ?TempSrc: Oral  ?PainSc: 5   ?   ? ?Patients Stated Pain Goal: 4 (02/14/22 0630) ? ?Complications: No notable events documented. ?
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
## 2022-02-14 NOTE — Anesthesia Procedure Notes (Signed)
Procedure Name: Intubation ?Date/Time: 02/14/2022 7:46 AM ?Performed by: Georgeanne Nim, CRNA ?Pre-anesthesia Checklist: Patient identified, Emergency Drugs available, Suction available, Patient being monitored and Timeout performed ?Patient Re-evaluated:Patient Re-evaluated prior to induction ?Oxygen Delivery Method: Circle system utilized ?Preoxygenation: Pre-oxygenation with 100% oxygen ?Induction Type: IV induction ?Ventilation: Mask ventilation without difficulty and Oral airway inserted - appropriate to patient size ?Laryngoscope Size: Mac and 4 ?Grade View: Grade I ?Tube size: 7.0 mm ?Number of attempts: 1 ?Placement Confirmation: ETT inserted through vocal cords under direct vision, positive ETCO2, CO2 detector and breath sounds checked- equal and bilateral ?Secured at: 22 cm ?Tube secured with: Tape ?Dental Injury: Teeth and Oropharynx as per pre-operative assessment  ? ? ? ? ?

## 2022-02-14 NOTE — Interval H&P Note (Signed)
History and Physical Interval Note: ? ?02/14/2022 ?7:36 AM ? ?Yvonne [name]  has presented today for surgery, with the diagnosis of menorrhagia with regular cycle ?uterine fibroid  ?Iron deficiency anemia.  The various methods of treatment have been discussed with the patient and family. After consideration of risks, benefits and other options for treatment, the patient has consented to  Procedure(s): ?XI ROBOTIC ASSISTED LAPAROSCOPIC HYSTERECTOMY AND SALPINGECTOMY (N/A) as a surgical intervention.  The patient's history has been reviewed, patient examined, no change in status, stable for surgery.  I have reviewed the patient's chart and labs.  Questions were answered to the patient's satisfaction.   ? ? ?[name] A Yajayra Feldt ? ? ?

## 2022-02-15 DIAGNOSIS — N920 Excessive and frequent menstruation with regular cycle: Secondary | ICD-10-CM | POA: Diagnosis not present

## 2022-02-15 LAB — URINALYSIS, ROUTINE W REFLEX MICROSCOPIC
Bacteria, UA: NONE SEEN
Bilirubin Urine: NEGATIVE
Glucose, UA: NEGATIVE mg/dL
Ketones, ur: NEGATIVE mg/dL
Nitrite: NEGATIVE
Protein, ur: 100 mg/dL — AB
RBC / HPF: >50 RBC/hpf — ABNORMAL HIGH (ref 0–5)
Specific Gravity, Urine: 1.017 (ref 1.005–1.030)
WBC, UA: >50 WBC/hpf — ABNORMAL HIGH (ref 0–5)
pH: 6 (ref 5.0–8.0)

## 2022-02-15 LAB — BASIC METABOLIC PANEL
Anion gap: 9 (ref 5–15)
BUN: 15 mg/dL (ref 6–20)
CO2: 26 mmol/L (ref 22–32)
Calcium: 8.7 mg/dL — ABNORMAL LOW (ref 8.9–10.3)
Chloride: 102 mmol/L (ref 98–111)
Creatinine, Ser: 1.15 mg/dL — ABNORMAL HIGH (ref 0.44–1.00)
GFR, Estimated: >60 mL/min (ref >60)
Glucose, Bld: 89 mg/dL (ref 70–99)
Potassium: 4.1 mmol/L (ref 3.5–5.1)
Sodium: 137 mmol/L (ref 135–145)

## 2022-02-15 LAB — CBC
HCT: 36.6 % (ref 36.0–46.0)
Hemoglobin: 11.4 g/dL — ABNORMAL LOW (ref 12.0–15.0)
MCH: 28.6 pg (ref 26.0–34.0)
MCHC: 31.1 g/dL (ref 30.0–36.0)
MCV: 92 fL (ref 80.0–100.0)
Platelets: 279 10*3/uL (ref 150–400)
RBC: 3.98 MIL/uL (ref 3.87–5.11)
RDW: 15.5 % (ref 11.5–15.5)
WBC: 11.2 10*3/uL — ABNORMAL HIGH (ref 4.0–10.5)
nRBC: 0 % (ref 0.0–0.2)

## 2022-02-15 LAB — PREGNANCY, URINE: Preg Test, Ur: NEGATIVE

## 2022-02-15 MED ORDER — MENTHOL 3 MG MT LOZG
LOZENGE | OROMUCOSAL | Status: AC
  Filled 2022-02-15: qty 9

## 2022-02-15 MED ORDER — ONDANSETRON HCL 4 MG/2ML IJ SOLN
INTRAMUSCULAR | Status: AC
  Filled 2022-02-15: qty 2

## 2022-02-15 MED ORDER — PANTOPRAZOLE SODIUM 40 MG PO TBEC
DELAYED_RELEASE_TABLET | ORAL | Status: AC
  Filled 2022-02-15: qty 1

## 2022-02-15 MED ORDER — OXYCODONE HCL 5 MG PO TABS
ORAL_TABLET | ORAL | Status: AC
  Filled 2022-02-15: qty 2

## 2022-02-15 MED ORDER — OXYCODONE HCL 5 MG PO TABS: 5.0000 mg | ORAL_TABLET | Freq: Four times a day (QID) | ORAL | 0 refills | Status: AC | PRN

## 2022-02-15 MED ORDER — KETOROLAC TROMETHAMINE 30 MG/ML IJ SOLN
INTRAMUSCULAR | Status: AC
  Filled 2022-02-15: qty 1

## 2022-02-15 MED ORDER — ACETAMINOPHEN 500 MG PO TABS
ORAL_TABLET | ORAL | Status: AC
  Filled 2022-02-15: qty 2

## 2022-02-15 MED ORDER — IBUPROFEN 600 MG PO TABS: 600.0000 mg | ORAL_TABLET | Freq: Four times a day (QID) | ORAL | 11 refills | Status: AC | PRN

## 2022-02-15 MED ORDER — SIMETHICONE 80 MG PO CHEW
CHEWABLE_TABLET | ORAL | Status: AC
  Filled 2022-02-15: qty 1

## 2022-02-15 NOTE — Op Note (Signed)
NAMEETHLEEN, Yvonne [name] MEDICAL RECORD NO: [id number] ACCOUNT NO: [id number] DATE OF BIRTH: 1986/06/20 FACILITY: [facility] LOCATION: [facility] PHYSICIAN: Dominiqua Cooner A. Garwin Brothers, MD  Operative Report   DATE OF PROCEDURE: 02/14/2022  PREOPERATIVE DIAGNOSES:  Menorrhagia with regular cycles, uterine fibroids, morbid obesity, iron deficiency anemia.  PROCEDURES:  Da Vinci robotic total hysterectomy, bilateral salpingectomy, right ovarian cystectomy, extensive adhesiolysis (hour and a half), cystoscopy  POSTOPERATIVE DIAGNOSES:  Menorrhagia with regular cycles, pedunculated submucosal, subserosal and intramural fibroids, severe pelvic adhesions, right ovarian endometrioma.  ANESTHESIA:  General.  SURGEON:  Servando Salina, MD  ASSISTANT:  Gaylord Shih, RNFA  DESCRIPTION OF PROCEDURE:  Under adequate anesthesia, the patient was placed in the dorsal lithotomy position.  She was positioned for robotic surgery.  The patient was sterilely prepped and draped in the usual fashion.  A 3-way Foley catheter was placed sterilely, which was draining Pyridium colored urine.  A weighted speculum was placed in the vagina.  Sims retractor was placed anteriorly.  The cervix was small. 0 Vicryl figure-of-eight sutures was placed on the anterior and posterior lip of the  cervix.  The uterus sounded to 11 cm.  A #10 mm uterine manipulator with a small KOH RUMI cup was introduced into the uterine cavity without incident.  The retractors were then removed.  Attention was then turned to the abdomen.  The patient had, had prior scars were noted from her previous robotic myomectomy. however, the prior midline incision was not utilized. Superior to that, 0.25% Marcaine was injected.  Vertical incision was then made.  A Veress needle was introduced.  Opening pressure of 2 was noted. The Veress needle was tested, 3.4 liters of CO2 was insufflated.  Veress needle was then removed.  The 8 mm camera robotic trocar port was  introduced into the abdomen. Robotic camera was introduced which showed entry into the abdomen was without incident.  Panoramic inspection quickly revealed on the left anterior abdominal wall adhesion blocking the view to placement of trocars .  The patient had a very narrow pelvis with enlarged uterus containing multiple fibroids.  The patient was placed in deep Trendelenburg position.  Additional port sites had been placed on the right. Multiple dilated bowel loops were present, which limited somewhat the ability for the surgery as well; however, the patient who had been placed in Trendelenburg position, due to her weight limited the ability to adjust the Trendelenburg position.  The patient was placed out of Trendelenburg temporarily for placement of the additional port sites.  Due to the presence of the  left anterior abdominal wall adhesion, the left far distant port was placed about 20 cm from the midline port site and under direct visualization after injection of 0.25% Marcaine, the port was placed under direct visualization.  At that point, the previous port site that had been used for AirSeal was then injected and an incision was then made and attempt to place the 8 mm AirSeal trocar was unsuccessful with meeting resistance and therefore, the camera was placed in the left lateral port for inspection of upper abdominal wall.  Once this was done, it was then noted that there was a focal omental adhesion that appears to be a herniation of the omentum into a previous incision on the anterior abdominal wall.  The additional right sided ports 10 cm apart were placed under direct visualization.  Decision was then made due to the  inability to put in the AirSeal to lyse the adhesion that was in that anterior  abdominal hernia wall site. Hot scissors was initially placed, but due to the angle was not able to perform the procedure.  The LigaSure was then subsequently utilized through several ports to subsequently  clamp, cauterize and cut the omental adhesion to that anterior abdominal wall.  This allowed subsequently for the AirSeal to then be placed.  Once that was done and the patient placed back in Trendelenburg position, a probe was utilized to further inspect the pelvis. It was notable for a  six cm right anterior fundal pedunculated fibroid, to which omental adhesions were noted overlying the fibroid. The left ovary and tube had adhesions to the round ligament as well as  the fallopian tube on the left could not be seen.  On the right, the fallopian tube was not seen.  There was enlarged  cystic right ovary that appears to be adherent to the right lateral wall of the uterus.  The robot was then docked and then had placement of the vessel sealer in arm #1, in arm #3 the long tip bipolar cautery and in arm #4, the monopolar scissors were placed.  I then went to the surgical console.  At the surgical console, inspection was again performed.  The ureters could not be seen despite  multiple attempting displacement of the bowel superiorly.  The procedure started on the right side where the omentum was adherent to the pedunculated fibroid. Using the vessel sealer and the long tip bipolar grasper as well as the scissors, the omental adhesions was removed.  Then, with that removal, it was then noted that the right tube was adherent to the lateral side of the uterus.  That was then lysed off of the uterus and put in its normal appearance, which was distorted.  Using the vessel sealer, the  mesosalpinx was serially clamped, cauterized and cut with that tube being removed.  The right ovary contained a distal dark cystic structure and there was basically shortening to almost absence of the right uteroovarian ligament.  Attention was then therefore turned back to the left side.  The adhesions to the round ligament from the bowel and epiploic was also lysed.  Once that was done, the fallopian tube was also found to have adhesions  between the tube and that left ovary, although that ovary  itself was otherwise normal.  The adhesions surrounding the tube were lysed in order to facilitate its removal, it was then also serially clamped, cauterized and cut and removed by going through the mesosalpinx.  The left retroperitoneal space was then  opened.  A window was placed in the medial leaf of the broad ligament.  Attempted again to look, with adhesions posteriorly being also lysed, ureter still could not be seen.  The left uteroovarian ligament was serially clamped, cauterized and then cut.   The round ligament on the left was then clamped, cauterized and cut.  The anterior leaf of the broad ligament was carried around anteriorly to the vesicouterine peritoneum, which was then opened transversely and the bladder was then sharply dissected off the lower uterine segment and displaced inferiorly.  The uterine vessels were skeletonized, they were then clamped, cauterized and serially cut.  Attention was then turned back to the right.  Bleeding was noted which was thought to be related to the fibroids, having had the serosal surface bleeding, several areas were cauterized.  Inspection was done. Irrigation, suction, checking for the adnexal areas for bleeders was also done.  The right ovary was lifted up at  which time there was a chocolate  thick material that extruded underneath this, when this was done, the dissection of the ovary off of the uterus was then done on that side, that brought out more of the dark chocolate material.  Once the ovary was separate from its attachment with the right retroperitoneal space been opened as well and that ligament cauterized, the ovary was left alone to then attempt to go back to the remaining portion of the vesicouterine peritoneum on the right.  This was done with the bladder further displaced  inferiorly. Due to the angle and the distortion of the uterus, the attempt to reach the right uterine vessels was  not amenable to the angle and therefore, the vaginal insufflator was inserted.  The cervicovaginal junction was opened anteriorly, carried  around to the left with the uterine vessels further being clamped, cauterized and cut and bringing the incision circumferentially with attention to the right uterine vessels on that side at that point being clamped, grasped and cut with the scissors  until the cervix was detached from the vagina. Attention went back to the right ovary which had partial extrusion of the chocolate colored thick content. The ovary was brought up into the field and the cyst opened with cautery and the cyst wall removed and the bleeders cauterized. Once this was done, the plan was then to use the robotic hook to morcellate the uterus as it was not amenable to being pulled out of the vagina.  Technical difficulties with respect to the instrument resulted  in my leaving the surgical console, going to the vagina and with Mayo scissors, scalpel, the uterus was morcellated and ultimately removed.  The insufflator was then placed back in.  I went back to the console. Inspection of the vaginal cuff showed some  bleeding, but there was also a small defect below the cuff, but not deep into the cul-de-sac, which would require separate closure.  The instruments therefore were replaced with long tip forceps in arm #1 and a mega suture needle driver in arm #4.  Once  that was done, a 0 Vicryl suture was placed and that closed the defect in the posterior vaginal cuff cul-de-sac with a running stitch of 0 Vicryl.  The small bleeding on the vaginal cuff edges were cauterized.  The vaginal cuff was then closed using 0  V-Loc suture x2.  Good hemostasis was then noted.  Due to this manipulation, decision was made to do a cystoscope. The Foley catheter was removed.  I cystoscoped the patient using the 70 degree scope.  The ureters were seen bilaterally, ejecting urine and the cystoscope was then removed.  The  Foley was reinserted.  A small rent in the right lateral wall of the vagina which had been seen from above was closed with 0 Vicryl figure-of-eight sutures vaginally.  The robot instruments removed and then the robot had been subsequently undocked.  The abdomen was irrigated and suctioned of debris.  The  Arista potato starch was placed over the vaginal cuff.  All the instruments were subsequently removed after deflating the abdomen and the incisions were closed with 4-0 Vicryl subcuticular closures. The vaginal cuff was digitally palpated and was felt to be well approximated.  SPECIMEN:  Uterus morcellated with both fallopian tubes and the right ovarian cyst wall. Specimen weighed 397.2 grams.  ESTIMATED BLOOD LOSS:  50 mL.  INTRAOPERATIVE FLUIDS:  1300 mL.  URINE OUTPUT:  250 mL.  Complication was none.  The patient  tolerated the procedure well and was transferred to recovery room in stable condition.   SHW D: 02/14/2022 5:27:42 pm T: 02/15/2022 5:00:00 am  JOB: [id number]/ [id number]

## 2022-02-15 NOTE — Discharge Summary (Signed)
Physician Discharge Summary  ?Patient ID: ?Yvonne [name] ?MRN: [id number] ?DOB/AGE: 1986-01-08 36 y.o. ? ?Admit date: 02/14/2022 ?Discharge date: 02/15/2022 ? ?Admission Diagnoses: symptomatic uterine fibroids, morbid obesity ? ?Discharge Diagnoses: symptomatic uterine fibroids, morbid obesity, extensive abdominopelvic adhesions, right ovarian endometrioma ?Principal Problem: ?  Uterine fibroid ?Active Problems: ?  S/P hysterectomy ? ? ?Discharged Condition: stable ? ?Hospital Course: pt underwent da vinci robotic total hysterectomy, bilateral salpingectomy, LOA, right ovarian cystectomy. Postoperative ?Course notable for removal of foley shortly after due to pain. Pt tolerated regular diet, voided w/o difficulty and had no complaint. ?Labs reviewed showed elevated creatinine. U/a done to assess for dehydration. Ucx sent based on the lab finding and treatment for ?Possible UTI. Pt denied any back pain ? R cvat. Reassess labs on Monday at office ? ?Consults: None ? ?Significant Diagnostic Studies: labs:  ? ?  Latest Ref Rng & Units 02/15/2022  ?  7:27 AM 02/11/2022  ?  9:14 AM 01/06/2022  ?  9:39 AM  ?CBC  ?WBC 4.0 - 10.5 K/uL 11.2   6.8   7.3    ?Hemoglobin 12.0 - 15.0 g/dL 11.4   12.6   12.0    ?Hematocrit 36.0 - 46.0 % 36.6   41.3   39.2    ?Platelets 150 - 400 K/uL 279   280   296    ? ? ?  Latest Ref Rng & Units 02/15/2022  ?  7:05 AM 02/11/2022  ?  9:14 AM 01/06/2022  ?  9:39 AM  ?BMP  ?Glucose 70 - 99 mg/dL 89   94   101    ?BUN 6 - 20 mg/dL '15   11   13    '$ ?Creatinine 0.44 - 1.00 mg/dL 1.15   0.88   0.84    ?Sodium 135 - 145 mmol/L 137   137   139    ?Potassium 3.5 - 5.1 mmol/L 4.1   4.0   4.0    ?Chloride 98 - 111 mmol/L 102   105   106    ?CO2 22 - 32 mmol/L '26   27   27    '$ ?Calcium 8.9 - 10.3 mg/dL 8.7   9.0   9.0    ? ? ? ?Treatments: surgery: Da Vinci robotic total hysterectomy, bilateral salpingectomy, extensive adhesiolysis, right ovarian cystectomy ? ?Discharge Exam: ?Blood pressure 110/61, pulse 73, temperature  98.5 ?F (36.9 ?C), resp. rate 17, height '5\' 8"'$  (1.727 m), weight (!) 145.2 kg, last menstrual period 11/12/2021, SpO2 98 %, not currently breastfeeding. ?General appearance: alert, cooperative, and no distress ?Resp: clear to auscultation bilaterally ?Cardio: regular rate and rhythm, S1, S2 normal, no murmur, click, rub or gallop ?GI: soft, non-tender; bowel sounds normal; no masses,  no organomegaly and incision: d/c/i ?Pelvic: deferred ?Extremities: Homans sign is negative, no sign of DVT ? ?Disposition: Discharge disposition: 01-Home or Self Care ? ? ? ? ? ? ?Discharge Instructions   ? ? Call MD for:  persistant dizziness or light-headedness   Complete by: As directed ?  ? Call MD for:  redness, tenderness, or signs of infection (pain, swelling, redness, odor or green/yellow discharge around incision site)   Complete by: As directed ?  ? Call MD for:  severe uncontrolled pain   Complete by: As directed ?  ? Call MD for:  temperature >100.4   Complete by: As directed ?  ? Diet - low sodium heart healthy   Complete by: As directed ?  ?  Discharge instructions   Complete by: As directed ?  ? Call if temperature greater than equal to 100.4, nothing per vagina for 4-6 weeks or severe nausea vomiting, increased incisional pain , drainage or redness in the incision site, no straining with bowel movements, showers no bath  ? Increase activity slowly   Complete by: As directed ?  ? No wound care   Complete by: As directed ?  ? ?  ? ?Allergies as of 02/15/2022   ? ?   Reactions  ? Blue Dyes (parenteral) Itching  ? ?  ? ?  ?Medication List  ?  ? ?TAKE these medications   ? ?albuterol 108 (90 Base) MCG/ACT inhaler ?Commonly known as: VENTOLIN HFA ?1-2 puffs every 6 (six) hours as needed. ?  ?ascorbic acid 250 MG Chew ?Commonly known as: VITAMIN C ?Chew 250 mg by mouth daily. ?  ?azelastine 0.1 % nasal spray ?Commonly known as: ASTELIN ?  ?cetirizine 10 MG tablet ?Commonly known as: ZYRTEC ?Take 10 mg by mouth daily as needed  for allergies. ?  ?cholecalciferol 25 MCG (1000 UNIT) tablet ?Commonly known as: VITAMIN D3 ?1 tablet ?  ?diltiazem 120 MG tablet ?Commonly known as: CARDIZEM ?Take 120 mg by mouth 4 (four) times daily. As need for elevated BP & HR ?  ?ferrous sulfate 325 (65 FE) MG tablet ?Take 650 mg by mouth daily with breakfast. ?  ?ibuprofen 600 MG tablet ?Commonly known as: ADVIL ?Take 1 tablet (600 mg total) by mouth every 6 (six) hours as needed. ?  ?magnesium oxide 400 MG tablet ?Commonly known as: MAG-OX ?Take by mouth. ?  ?Misc. Devices Misc ?Pt uses CPAP every night. ?  ?ondansetron 8 MG tablet ?Commonly known as: ZOFRAN ?Take 1 tablet (8 mg total) by mouth every 8 (eight) hours as needed for nausea or vomiting. ?  ?oxyCODONE 5 MG immediate release tablet ?Commonly known as: Oxy IR/ROXICODONE ?Take 1 tablet (5 mg total) by mouth every 6 (six) hours as needed for up to 7 days for moderate pain or severe pain. ?  ?Synthroid 175 MCG tablet ?Generic drug: levothyroxine ?Take 175 mcg by mouth daily. ?  ?traZODone 50 MG tablet ?Commonly known as: DESYREL ?1 tablet at bedtime as needed ?  ?valACYclovir 500 MG tablet ?Commonly known as: VALTREX ?Take 500 mg by mouth daily as needed (outbreaks). ?  ? ?  ? ? Follow-up Information   ? ? Servando Salina, MD Follow up.   ?Specialty: Obstetrics and Gynecology ?Contact information: ?[address] ?[place] [address] ?[phone] ? ? ?  ?  ? ?  ?  ? ?  ? ? ?Signed: ?Camillo Quadros A Italo Banton ?02/15/2022, 9:20 AM ? ? ?

## 2022-02-15 NOTE — Progress Notes (Signed)
Subjective: ?Patient reports tolerating PO, + BM, and no problems voiding.   ?Stuck x 3 for CBC... specimen clotted pr lab. Denies back pain. Had shoulder pain post op but doing much better after heating pad. Hx right rotator cuff injury in the past. Voided multiple times. Reviewed intraop findings with pt and her husband ?Objective: ?I have reviewed patient's vital signs.  vital signs, intake and output, and labs. ?Vitals:  ? 02/15/22 0200 02/15/22 0615  ?BP: 111/65 110/61  ?Pulse: 79 73  ?Resp: 20 17  ?Temp: 98.4 ?F (36.9 ?C) 98.5 ?F (36.9 ?C)  ?SpO2: 97% 98%  ? ?I/O last 3 completed shifts: ?In: 4140.4 [P.O.:1680; I.V.:2460.4] ?Out: 1250 [Urine:1200; Blood:50] ?No intake/output data recorded. ? ?Lab Results  ?Component Value Date  ? WBC 11.2 (H) 02/15/2022  ? HGB 11.4 (L) 02/15/2022  ? HCT 36.6 02/15/2022  ? MCV 92.0 02/15/2022  ? PLT 279 02/15/2022  ? ?Lab Results  ?Component Value Date  ? CREATININE 1.15 (H) 02/15/2022  ? ? ?EXAM ?General: alert, cooperative, and no distress ?Resp: clear to auscultation bilaterally ?Cardio: regular rate and rhythm, S1, S2 normal, no murmur, click, rub or gallop ?GI: soft, non-tender; bowel sounds normal; no masses,  no organomegaly and incision: clean, dry, and intact ?Extremities: edema tr ?Vaginal Bleeding: minimal ?Back. ? Mild CVat on right ?Assessment: ?s/p Procedure(s): ?XI ROBOTIC ASSISTED LAPAROSCOPIC HYSTERECTOMY AND SALPINGECTOMY, LYSIS OF EXTENSIVE ADHESIONS, RIGHT OVARIAN CYSTECTOMY  ?CYSTOSCOPY: stable, progressing well, and tolerating diet ?Elevated creatinine noted ? Related to dehydration. Cystoscopy was normal ?Plan: ?Encourage ambulation ?Advance to PO medication ?Discontinue IV fluids ?Await CBC ?Check urinalysis for urine SpGravity ?Await CBC ?D/c instructions reviewed ? ? LOS: 0 days  ? ? ?Marvene Staff, MD 02/15/2022 9:04 AM ? ?  ?02/15/2022, 9:04 AM  ?

## 2022-02-16 LAB — URINE CULTURE: Culture: NO GROWTH

## 2022-02-17 ENCOUNTER — Encounter (HOSPITAL_BASED_OUTPATIENT_CLINIC_OR_DEPARTMENT_OTHER): Payer: Self-pay | Admitting: Obstetrics and Gynecology

## 2022-02-17 LAB — SURGICAL PATHOLOGY

## 2022-02-18 ENCOUNTER — Emergency Department (HOSPITAL_COMMUNITY): Payer: Managed Care, Other (non HMO)

## 2022-02-18 ENCOUNTER — Emergency Department (HOSPITAL_COMMUNITY)
Admission: EM | Admit: 2022-02-18 | Discharge: 2022-02-18 | Disposition: A | Discharge status: Home / Self Care | Payer: Managed Care, Other (non HMO) | Attending: Emergency Medicine | Admitting: Emergency Medicine

## 2022-02-18 ENCOUNTER — Encounter (HOSPITAL_COMMUNITY): Payer: Self-pay | Admitting: Emergency Medicine

## 2022-02-18 DIAGNOSIS — Z20822 Contact with and (suspected) exposure to covid-19: Secondary | ICD-10-CM | POA: Diagnosis not present

## 2022-02-18 DIAGNOSIS — R519 Headache, unspecified: Secondary | ICD-10-CM | POA: Diagnosis not present

## 2022-02-18 DIAGNOSIS — R5082 Postprocedural fever: Secondary | ICD-10-CM | POA: Diagnosis not present

## 2022-02-18 DIAGNOSIS — R110 Nausea: Secondary | ICD-10-CM | POA: Diagnosis not present

## 2022-02-18 DIAGNOSIS — R350 Frequency of micturition: Secondary | ICD-10-CM | POA: Insufficient documentation

## 2022-02-18 DIAGNOSIS — R509 Fever, unspecified: Secondary | ICD-10-CM

## 2022-02-18 DIAGNOSIS — R531 Weakness: Secondary | ICD-10-CM | POA: Insufficient documentation

## 2022-02-18 DIAGNOSIS — R1084 Generalized abdominal pain: Secondary | ICD-10-CM | POA: Diagnosis not present

## 2022-02-18 DIAGNOSIS — R5383 Other fatigue: Secondary | ICD-10-CM | POA: Insufficient documentation

## 2022-02-18 LAB — CBC WITH DIFFERENTIAL/PLATELET
Abs Immature Granulocytes: 0.05 10*3/uL (ref 0.00–0.07)
Basophils Absolute: 0.1 10*3/uL (ref 0.0–0.1)
Basophils Relative: 1 %
Eosinophils Absolute: 0.2 10*3/uL (ref 0.0–0.5)
Eosinophils Relative: 2 %
HCT: 37.9 % (ref 36.0–46.0)
Hemoglobin: 11.6 g/dL — ABNORMAL LOW (ref 12.0–15.0)
Immature Granulocytes: 1 %
Lymphocytes Relative: 29 %
Lymphs Abs: 2.6 10*3/uL (ref 0.7–4.0)
MCH: 27.9 pg (ref 26.0–34.0)
MCHC: 30.6 g/dL (ref 30.0–36.0)
MCV: 91.1 fL (ref 80.0–100.0)
Monocytes Absolute: 0.5 10*3/uL (ref 0.1–1.0)
Monocytes Relative: 5 %
Neutro Abs: 5.6 10*3/uL (ref 1.7–7.7)
Neutrophils Relative %: 62 %
Platelets: 294 10*3/uL (ref 150–400)
RBC: 4.16 MIL/uL (ref 3.87–5.11)
RDW: 14.5 % (ref 11.5–15.5)
WBC: 9 10*3/uL (ref 4.0–10.5)
nRBC: 0 % (ref 0.0–0.2)

## 2022-02-18 LAB — TYPE AND SCREEN
ABO/RH(D): A POS
Antibody Screen: POSITIVE
Unit division: 0
Unit division: 0

## 2022-02-18 LAB — COMPREHENSIVE METABOLIC PANEL
ALT: 25 U/L (ref 0–44)
AST: 27 U/L (ref 15–41)
Albumin: 3.6 g/dL (ref 3.5–5.0)
Alkaline Phosphatase: 75 U/L (ref 38–126)
Anion gap: 8 (ref 5–15)
BUN: 9 mg/dL (ref 6–20)
CO2: 28 mmol/L (ref 22–32)
Calcium: 9.5 mg/dL (ref 8.9–10.3)
Chloride: 101 mmol/L (ref 98–111)
Creatinine, Ser: 0.88 mg/dL (ref 0.44–1.00)
GFR, Estimated: >60 mL/min (ref >60)
Glucose, Bld: 98 mg/dL (ref 70–99)
Potassium: 4.4 mmol/L (ref 3.5–5.1)
Sodium: 137 mmol/L (ref 135–145)
Total Bilirubin: 1.1 mg/dL (ref 0.3–1.2)
Total Protein: 6.8 g/dL (ref 6.5–8.1)

## 2022-02-18 LAB — URINALYSIS, ROUTINE W REFLEX MICROSCOPIC
Bilirubin Urine: NEGATIVE
Glucose, UA: NEGATIVE mg/dL
Ketones, ur: NEGATIVE mg/dL
Leukocytes,Ua: NEGATIVE
Nitrite: NEGATIVE
Protein, ur: NEGATIVE mg/dL
Specific Gravity, Urine: 1.015 (ref 1.005–1.030)
pH: 6 (ref 5.0–8.0)

## 2022-02-18 LAB — LACTIC ACID, PLASMA
Lactic Acid, Venous: 1.7 mmol/L (ref 0.5–1.9)
Lactic Acid, Venous: 0.9 mmol/L (ref 0.5–1.9)

## 2022-02-18 LAB — RESP PANEL BY RT-PCR (FLU A&B, COVID) ARPGX2
Influenza A by PCR: NEGATIVE
Influenza B by PCR: NEGATIVE
SARS Coronavirus 2 by RT PCR: NEGATIVE

## 2022-02-18 LAB — BPAM RBC
Blood Product Expiration Date: 202305232359
Blood Product Expiration Date: 202305232359
Unit Type and Rh: 6200
Unit Type and Rh: 6200

## 2022-02-18 LAB — I-STAT BETA HCG BLOOD, ED (MC, WL, AP ONLY): I-stat hCG, quantitative: <5 m[IU]/mL (ref <5)

## 2022-02-18 MED ORDER — MORPHINE SULFATE (PF) 4 MG/ML IV SOLN
4.0000 mg | Freq: Once | INTRAVENOUS | Status: AC
  Administered 2022-02-18: 4 mg via INTRAVENOUS
  Filled 2022-02-18: qty 1

## 2022-02-18 MED ORDER — TRAMADOL HCL 50 MG PO TABS: 50.0000 mg | ORAL_TABLET | Freq: Four times a day (QID) | ORAL | 0 refills | Status: AC | PRN

## 2022-02-18 MED ORDER — ONDANSETRON HCL 4 MG/2ML IJ SOLN
4.0000 mg | Freq: Once | INTRAMUSCULAR | Status: AC
  Administered 2022-02-18: 4 mg via INTRAVENOUS
  Filled 2022-02-18: qty 2

## 2022-02-18 MED ORDER — SODIUM CHLORIDE 0.9 % IV BOLUS
1000.0000 mL | Freq: Once | INTRAVENOUS | Status: AC
  Administered 2022-02-18: 1000 mL via INTRAVENOUS

## 2022-02-18 MED ORDER — IOHEXOL 350 MG/ML SOLN
100.0000 mL | Freq: Once | INTRAVENOUS | Status: AC | PRN
  Administered 2022-02-18: 100 mL via INTRAVENOUS

## 2022-02-18 NOTE — ED Notes (Signed)
Patient transported to CT 

## 2022-02-18 NOTE — ED Triage Notes (Signed)
Pty. Stated, I had a hysterectomy on Friday and Im still having chills and fever, sweats, and still in pain and some dizziness with nausea. ?

## 2022-02-18 NOTE — Discharge Instructions (Signed)
Please see Dr. Garwin Brothers for follow-up. ? ?Take Tylenol or Motrin for fever ? ?You can take tramadol for pain or oxycodone as prescribed by Dr. Garwin Brothers. ? ?Please follow-up with Dr. Lesle Chris office. ? ?Return to ER if you have worse abdominal pain, fever >101, vomiting ?

## 2022-02-18 NOTE — ED Provider Notes (Signed)
?[facility] ?Provider Note ? ? ?CSN: [id number] ?Arrival date & time: 02/18/22  1213 ? ?  ? ?History ?Chief Complaint  ?Patient presents with  ? Chills  ? Fever  ? Headache  ? Urinary Frequency  ? ? ?Yvonne [name] is a 36 y.o. female. ? ?Patient presents with fever, chills and generalized fatigue for the past 2 days. She is 4 days post op a hysterectomy performed by Dr. Garwin Brothers. Tmax 100.2 at home. She had a post surgical follow up yesterday when she was still having these symptoms due they were not as severe, she was recommended by her doctor to monitor and see how she continues to feel. But then today she began to feel worse as she started to get more active so she called her doctor and saw her for another evaluation which is when she recommended her to come to the ED. Denies any sick contacts and reports that these symptoms started after the surgical procedure.  ? ?The history is provided by the patient. No language interpreter was used.  ?Fever ?Max temp prior to arrival:  100.2 ?Onset quality:  Gradual ?Duration:  4 days ?Timing:  Constant ?Progression:  Worsening ?Chronicity:  New ?Relieved by:  Nothing ?Worsened by:  Nothing ?Associated symptoms: chills, headaches and nausea   ?Associated symptoms: no chest pain, no confusion, no congestion, no cough, no diarrhea and no vomiting   ?Headache ?Associated symptoms: fatigue, fever, nausea and weakness (generalized)   ?Associated symptoms: no congestion, no cough, no diarrhea and no vomiting   ?Urinary Frequency ?Associated symptoms include headaches. Pertinent negatives include no chest pain and no shortness of breath.  ? ?  ? ?Home Medications ?Prior to Admission medications   ?Medication Sig Start Date End Date Taking? Authorizing Provider  ?albuterol (VENTOLIN HFA) 108 (90 Base) MCG/ACT inhaler 1-2 puffs every 6 (six) hours as needed. 06/12/21   [provider]  ?ascorbic acid (VITAMIN C) 250 MG CHEW Chew 250 mg by  mouth daily.    [provider]  ?azelastine (ASTELIN) 0.1 % nasal spray  12/08/19   [provider]  ?cetirizine (ZYRTEC) 10 MG tablet Take 10 mg by mouth daily as needed for allergies.    [provider]  ?cholecalciferol (VITAMIN D3) 25 MCG (1000 UNIT) tablet 1 tablet    [provider]  ?diltiazem (CARDIZEM) 120 MG tablet Take 120 mg by mouth 4 (four) times daily. As need for elevated BP & HR    [provider]  ?ferrous sulfate 325 (65 FE) MG tablet Take 650 mg by mouth daily with breakfast.    [provider]  ?ibuprofen (ADVIL) 600 MG tablet Take 1 tablet (600 mg total) by mouth every 6 (six) hours as needed. 02/15/22   Servando Salina, MD  ?magnesium oxide (MAG-OX) 400 MG tablet Take by mouth. 07/06/20   [provider]  ?[place]. Devices MISC Pt uses CPAP every night. 02/15/19   [provider]  ?ondansetron (ZOFRAN) 8 MG tablet Take 1 tablet (8 mg total) by mouth every 8 (eight) hours as needed for nausea or vomiting. 08/19/21   Orson Slick, MD  ?oxyCODONE (OXY IR/ROXICODONE) 5 MG immediate release tablet Take 1 tablet (5 mg total) by mouth every 6 (six) hours as needed for up to 7 days for moderate pain or severe pain. 02/15/22 02/22/22  Servando Salina, MD  ?SYNTHROID 175 MCG tablet Take 175 mcg by mouth daily. 10/13/19   [provider]  ?traZODone (DESYREL) 50 MG tablet 1 tablet at bedtime as needed 12/13/19   [provider]  ?valACYclovir (VALTREX) 500 MG tablet Take 500 mg by mouth daily as needed (outbreaks).    [provider]  ?   ? ?Allergies    ?Blue dyes (parenteral)   ? ?Review of Systems   ?Review of Systems  ?Constitutional:  Positive for chills, fatigue and fever.  ?HENT:  Negative for congestion.   ?Respiratory:  Negative for cough and shortness of breath.   ?Cardiovascular:  Negative for chest pain.  ?Gastrointestinal:  Positive for nausea. Negative for diarrhea and vomiting.   ?Genitourinary:  Positive for frequency.  ?Neurological:  Positive for weakness (generalized) and headaches.  ?Psychiatric/Behavioral:  Negative for confusion.   ? ?Physical Exam ?Updated Vital Signs ?BP (!) 134/92   Pulse 64   Temp 99 ?F (37.2 ?C) (Oral)   Resp 16   Ht '5\' 8"'$  (1.727 m)   Wt (!) 147.9 kg   LMP 11/12/2021 (Exact Date)   SpO2 96%   BMI 49.57 kg/m?  ?Physical Exam ?Vitals reviewed.  ?Constitutional:   ?   General: She is not in acute distress. ?   Appearance: She is not toxic-appearing.  ?HENT:  ?   Head: Normocephalic and atraumatic.  ?   Nose: Nose normal. No congestion or rhinorrhea.  ?   Mouth/Throat:  ?   Mouth: Mucous membranes are moist.  ?   Pharynx: Oropharynx is clear. No oropharyngeal exudate or posterior oropharyngeal erythema.  ?Eyes:  ?   General: No scleral icterus.    ?   Right eye: No discharge.     ?   Left eye: No discharge.  ?   Extraocular Movements: Extraocular movements intact.  ?   Conjunctiva/sclera: Conjunctivae normal.  ?Cardiovascular:  ?   Rate and Rhythm: Normal rate and regular rhythm.  ?   Pulses: Normal pulses.  ?   Heart sounds: Normal heart sounds. No murmur heard. ?  No gallop.  ?Pulmonary:  ?   Effort: Pulmonary effort is normal. No respiratory distress.  ?   Breath sounds: Normal breath sounds. No wheezing, rhonchi or rales.  ?Abdominal:  ?   Palpations: Abdomen is soft.  ?   Tenderness: There is abdominal tenderness (generalized tenderness upon light palpation).  ?   Comments: Multiple residual scars from procedure without bleeding or drainage noted  ?Musculoskeletal:  ?   Cervical back: No tenderness.  ?   Left lower leg: No edema.  ?Lymphadenopathy:  ?   Cervical: No cervical adenopathy.  ?Skin: ?   General: Skin is warm and dry.  ?   Capillary Refill: Capillary refill takes less than 2 seconds.  ?   Coloration: Skin is not pale.  ?   Findings: No erythema.  ?Neurological:  ?   General: No focal deficit present.  ?   Mental Status: She is alert and  oriented to person, place, and time.  ?   Motor: No weakness.  ?Psychiatric:     ?   Mood and Affect: Mood normal.     ?   Behavior: Behavior normal.  ? ? ?ED Results / Procedures / Treatments   ?Labs ?(all labs ordered are listed, but only abnormal results are displayed) ?Labs Reviewed  ?CBC WITH DIFFERENTIAL/PLATELET - Abnormal; Notable for the following components:  ?    Result Value  ? Hemoglobin 11.6 (*)   ? All other components within normal limits  ?URINALYSIS,  ROUTINE W REFLEX MICROSCOPIC - Abnormal; Notable for the following components:  ? Hgb urine dipstick MODERATE (*)   ? Bacteria, UA RARE (*)   ? All other components within normal limits  ?LACTIC ACID, PLASMA  ?LACTIC ACID, PLASMA  ?COMPREHENSIVE METABOLIC PANEL  ?I-STAT BETA HCG BLOOD, ED ([facility], [facility], [facility] ONLY)  ? ? ?EKG ?None ? ?Radiology ?DG Chest 2 View ? ?Result Date: 02/18/2022 ?CLINICAL DATA:  Fever, chills and headache. EXAM: CHEST - 2 VIEW COMPARISON:  06/24/2019 FINDINGS: Heart size is normal. Mediastinal shadows are normal. The lungs are clear. No bronchial thickening. No infiltrate, mass, effusion or collapse. Pulmonary vascularity is normal. No bony abnormality. IMPRESSION: Normal chest Electronically Signed   By: Nelson Chimes M.D.   On: 02/18/2022 13:58   ? ?Procedures ?Procedures  ?None ? ?Medications Ordered in ED ?Medications - No data to display ? ?ED Course/ Medical Decision Making/ A&P ?  ?                        ?Medical Decision Making ?Amount and/or Complexity of Data Reviewed ?Labs: ordered. ?Radiology: ordered. ? ? ? ?Patient presents with new onset fever and chills for the past 2 days, she is postop 4 days from her hysterectomy performed by Dr. Garwin Brothers. Sent here for an evaluation given recent procedure. Pending blood work. CXR notable for no acute changes or focal findings that would correlate with respiratory infectious etiology. UA overall non-contributory. CT abdomen/pelvis pending to determine any acute changes post procedure.  Ongoing studies and workup pending at the time of shift change so sign out provided to oncoming provider to determine most appropriate course of action.  ? ? ?Final Clinical Impression(s) / ED Diagnoses ?Final diagnoses:  ?Fever

## 2022-02-18 NOTE — ED Provider Notes (Signed)
?  Physical Exam  ?BP 139/81 (BP Location: Right Arm)   Pulse 73   Temp 99 ?F (37.2 ?C) (Oral)   Resp 19   Ht '5\' 8"'$  (1.727 m)   Wt (!) 147.9 kg   LMP 11/12/2021 (Exact Date)   SpO2 99%   BMI 49.57 kg/m?  ? ?Physical Exam ? ?Procedures  ?Procedures ? ?ED Course / MDM  ?  ?Medical Decision Making ?Care assumed at 3 PM.  Patient is postop day #4 and had a low-grade temp 100.2.  Sent in from Dr. Harvie Bridge office.  Signed out pending CT abdomen pelvis and labs and lactate ? ?5:59 PM ?White blood cell count is normal and lactate is normal.  CT showed small 3 x 2 cm fluid density at the right urine bed but no obvious drainable abscess. There is air in the abdomen consistent with recent laparoscopy.  I discussed case with Dr. Garwin Brothers.  She reviewed the imaging and states that since the imaging and white blood cell count is normal, she recommend discharge home. Patient will see her in her office for follow up  ? ?Problems Addressed: ?Fever and chills: acute illness or injury ?Postoperative fever: acute illness or injury ? ?Amount and/or Complexity of Data Reviewed ?Labs: ordered. Decision-making details documented in ED Course. ?Radiology: ordered and independent interpretation performed. Decision-making details documented in ED Course. ? ?Risk ?Prescription drug management. ? ? ? ? ? ? ? ?  ?Drenda Freeze, MD ?02/18/22 1800 ? ?

## 2022-02-27 ENCOUNTER — Other Ambulatory Visit: Payer: Self-pay | Admitting: Hematology and Oncology

## 2022-02-27 MED ORDER — ONDANSETRON HCL 8 MG PO TABS: 8.0000 mg | ORAL_TABLET | Freq: Three times a day (TID) | ORAL | 0 refills | Status: DC | PRN

## 2022-03-17 ENCOUNTER — Other Ambulatory Visit: Payer: Managed Care, Other (non HMO)

## 2022-03-17 ENCOUNTER — Inpatient Hospital Stay (HOSPITAL_BASED_OUTPATIENT_CLINIC_OR_DEPARTMENT_OTHER): Payer: Managed Care, Other (non HMO) | Admitting: Hematology and Oncology

## 2022-03-17 ENCOUNTER — Inpatient Hospital Stay: Payer: Managed Care, Other (non HMO) | Attending: Hematology

## 2022-03-17 ENCOUNTER — Ambulatory Visit: Payer: Managed Care, Other (non HMO) | Admitting: Hematology and Oncology

## 2022-03-17 ENCOUNTER — Other Ambulatory Visit: Payer: Self-pay

## 2022-03-17 ENCOUNTER — Other Ambulatory Visit: Payer: Self-pay | Admitting: Hematology and Oncology

## 2022-03-17 VITALS — BP 126/80 | HR 76 | Temp 97.8°F | Resp 18 | Ht 68.0 in | Wt 321.1 lb

## 2022-03-17 DIAGNOSIS — Z90710 Acquired absence of both cervix and uterus: Secondary | ICD-10-CM | POA: Diagnosis not present

## 2022-03-17 DIAGNOSIS — D500 Iron deficiency anemia secondary to blood loss (chronic): Secondary | ICD-10-CM | POA: Diagnosis present

## 2022-03-17 DIAGNOSIS — Z9079 Acquired absence of other genital organ(s): Secondary | ICD-10-CM | POA: Diagnosis not present

## 2022-03-17 DIAGNOSIS — N920 Excessive and frequent menstruation with regular cycle: Secondary | ICD-10-CM | POA: Insufficient documentation

## 2022-03-17 DIAGNOSIS — Z87891 Personal history of nicotine dependence: Secondary | ICD-10-CM | POA: Diagnosis not present

## 2022-03-17 LAB — CBC WITH DIFFERENTIAL (CANCER CENTER ONLY)
Abs Immature Granulocytes: 0.01 10*3/uL (ref 0.00–0.07)
Basophils Absolute: 0.1 10*3/uL (ref 0.0–0.1)
Basophils Relative: 1 %
Eosinophils Absolute: 0.2 10*3/uL (ref 0.0–0.5)
Eosinophils Relative: 3 %
HCT: 40 % (ref 36.0–46.0)
Hemoglobin: 12.6 g/dL (ref 12.0–15.0)
Immature Granulocytes: 0 %
Lymphocytes Relative: 39 %
Lymphs Abs: 3.2 10*3/uL (ref 0.7–4.0)
MCH: 28.8 pg (ref 26.0–34.0)
MCHC: 31.5 g/dL (ref 30.0–36.0)
MCV: 91.3 fL (ref 80.0–100.0)
Monocytes Absolute: 0.4 10*3/uL (ref 0.1–1.0)
Monocytes Relative: 5 %
Neutro Abs: 4.3 10*3/uL (ref 1.7–7.7)
Neutrophils Relative %: 52 %
Platelet Count: 299 10*3/uL (ref 150–400)
RBC: 4.38 MIL/uL (ref 3.87–5.11)
RDW: 12.4 % (ref 11.5–15.5)
WBC Count: 8.1 10*3/uL (ref 4.0–10.5)
nRBC: 0 % (ref 0.0–0.2)

## 2022-03-17 LAB — CMP (CANCER CENTER ONLY)
ALT: 13 U/L (ref 0–44)
AST: 14 U/L — ABNORMAL LOW (ref 15–41)
Albumin: 4.3 g/dL (ref 3.5–5.0)
Alkaline Phosphatase: 87 U/L (ref 38–126)
Anion gap: 6 (ref 5–15)
BUN: 13 mg/dL (ref 6–20)
CO2: 26 mmol/L (ref 22–32)
Calcium: 9.8 mg/dL (ref 8.9–10.3)
Chloride: 105 mmol/L (ref 98–111)
Creatinine: 0.81 mg/dL (ref 0.44–1.00)
GFR, Estimated: >60 mL/min (ref >60)
Glucose, Bld: 100 mg/dL — ABNORMAL HIGH (ref 70–99)
Potassium: 4.1 mmol/L (ref 3.5–5.1)
Sodium: 137 mmol/L (ref 135–145)
Total Bilirubin: 0.6 mg/dL (ref 0.3–1.2)
Total Protein: 7.4 g/dL (ref 6.5–8.1)

## 2022-03-17 LAB — RETIC PANEL
Immature Retic Fract: 12.1 % (ref 2.3–15.9)
RBC.: 4.36 MIL/uL (ref 3.87–5.11)
Retic Count, Absolute: 81.5 10*3/uL (ref 19.0–186.0)
Retic Ct Pct: 1.9 % (ref 0.4–3.1)
Reticulocyte Hemoglobin: 29 pg (ref >27.9)

## 2022-03-17 LAB — FERRITIN: Ferritin: 6 ng/mL — ABNORMAL LOW (ref 11–307)

## 2022-03-17 LAB — IRON AND IRON BINDING CAPACITY (CC-WL,HP ONLY)
Iron: 43 ug/dL (ref 28–170)
Saturation Ratios: 10 % — ABNORMAL LOW (ref 10.4–31.8)
TIBC: 452 ug/dL — ABNORMAL HIGH (ref 250–450)
UIBC: 409 ug/dL (ref 148–442)
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
## 2022-09-16 ENCOUNTER — Other Ambulatory Visit: Payer: Self-pay

## 2022-09-16 DIAGNOSIS — D500 Iron deficiency anemia secondary to blood loss (chronic): Secondary | ICD-10-CM

## 2022-09-17 ENCOUNTER — Inpatient Hospital Stay (HOSPITAL_BASED_OUTPATIENT_CLINIC_OR_DEPARTMENT_OTHER): Payer: Managed Care, Other (non HMO) | Admitting: Hematology and Oncology

## 2022-09-17 ENCOUNTER — Inpatient Hospital Stay: Payer: Managed Care, Other (non HMO) | Attending: Hematology and Oncology

## 2022-09-17 VITALS — BP 137/82 | HR 80 | Temp 98.4°F | Resp 16 | Wt 346.5 lb

## 2022-09-17 DIAGNOSIS — Z87891 Personal history of nicotine dependence: Secondary | ICD-10-CM | POA: Insufficient documentation

## 2022-09-17 DIAGNOSIS — N920 Excessive and frequent menstruation with regular cycle: Secondary | ICD-10-CM | POA: Diagnosis present

## 2022-09-17 DIAGNOSIS — D500 Iron deficiency anemia secondary to blood loss (chronic): Secondary | ICD-10-CM

## 2022-09-17 DIAGNOSIS — Z90710 Acquired absence of both cervix and uterus: Secondary | ICD-10-CM | POA: Insufficient documentation

## 2022-09-17 DIAGNOSIS — Z9079 Acquired absence of other genital organ(s): Secondary | ICD-10-CM | POA: Diagnosis not present

## 2022-09-17 LAB — CMP (CANCER CENTER ONLY)
ALT: 21 U/L (ref 0–44)
AST: 21 U/L (ref 15–41)
Albumin: 3.8 g/dL (ref 3.5–5.0)
Alkaline Phosphatase: 93 U/L (ref 38–126)
Anion gap: 7 (ref 5–15)
BUN: 13 mg/dL (ref 6–20)
CO2: 25 mmol/L (ref 22–32)
Calcium: 9 mg/dL (ref 8.9–10.3)
Chloride: 107 mmol/L (ref 98–111)
Creatinine: 0.91 mg/dL (ref 0.44–1.00)
GFR, Estimated: >60 mL/min (ref >60)
Glucose, Bld: 93 mg/dL (ref 70–99)
Potassium: 3.8 mmol/L (ref 3.5–5.1)
Sodium: 139 mmol/L (ref 135–145)
Total Bilirubin: 0.3 mg/dL (ref 0.3–1.2)
Total Protein: 7.1 g/dL (ref 6.5–8.1)

## 2022-09-17 LAB — CBC WITH DIFFERENTIAL (CANCER CENTER ONLY)
Abs Immature Granulocytes: 0.02 10*3/uL (ref 0.00–0.07)
Basophils Absolute: 0.1 10*3/uL (ref 0.0–0.1)
Basophils Relative: 1 %
Eosinophils Absolute: 0.2 10*3/uL (ref 0.0–0.5)
Eosinophils Relative: 2 %
HCT: 38.5 % (ref 36.0–46.0)
Hemoglobin: 12 g/dL (ref 12.0–15.0)
Immature Granulocytes: 0 %
Lymphocytes Relative: 36 %
Lymphs Abs: 2.8 10*3/uL (ref 0.7–4.0)
MCH: 27.7 pg (ref 26.0–34.0)
MCHC: 31.2 g/dL (ref 30.0–36.0)
MCV: 88.9 fL (ref 80.0–100.0)
Monocytes Absolute: 0.5 10*3/uL (ref 0.1–1.0)
Monocytes Relative: 7 %
Neutro Abs: 4.4 10*3/uL (ref 1.7–7.7)
Neutrophils Relative %: 54 %
Platelet Count: 294 10*3/uL (ref 150–400)
RBC: 4.33 MIL/uL (ref 3.87–5.11)
RDW: 13 % (ref 11.5–15.5)
WBC Count: 8 10*3/uL (ref 4.0–10.5)
nRBC: 0 % (ref 0.0–0.2)

## 2022-09-17 LAB — RETIC PANEL
Immature Retic Fract: 14.8 % (ref 2.3–15.9)
RBC.: 4.27 MIL/uL (ref 3.87–5.11)
Retic Count, Absolute: 66.6 10*3/uL (ref 19.0–186.0)
Retic Ct Pct: 1.6 % (ref 0.4–3.1)
Reticulocyte Hemoglobin: 31.1 pg (ref >27.9)

## 2022-09-17 LAB — IRON AND IRON BINDING CAPACITY (CC-WL,HP ONLY)
Iron: 36 ug/dL (ref 28–170)
Saturation Ratios: 8 % — ABNORMAL LOW (ref 10.4–31.8)
TIBC: 454 ug/dL — ABNORMAL HIGH (ref 250–450)
UIBC: 418 ug/dL

## 2022-09-17 LAB — FERRITIN: Ferritin: 5 ng/mL — ABNORMAL LOW (ref 11–307)

## 2022-09-17 MED ORDER — FERROUS SULFATE 325 (65 FE) MG PO TABS: 325.0000 mg | ORAL_TABLET | Freq: Every day | ORAL | 3 refills | Status: AC
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
## 2022-09-18 ENCOUNTER — Other Ambulatory Visit: Payer: Self-pay | Admitting: Hematology and Oncology

## 2022-09-18 MED ORDER — ONDANSETRON HCL 8 MG PO TABS: 8.0000 mg | ORAL_TABLET | Freq: Three times a day (TID) | ORAL | 0 refills | Status: DC | PRN

## 2022-09-18 MED ORDER — CETIRIZINE HCL 10 MG PO TABS: 10.0000 mg | ORAL_TABLET | Freq: Every day | ORAL | 1 refills | Status: DC | PRN

## 2022-09-18 MED ORDER — SYNTHROID 175 MCG PO TABS: 175.0000 ug | ORAL_TABLET | Freq: Every day | ORAL | 1 refills | Status: DC

## 2022-09-19 ENCOUNTER — Telehealth: Payer: Self-pay | Admitting: *Deleted

## 2022-09-19 NOTE — Telephone Encounter (Signed)
-----   Message from Orson Slick, MD sent at 09/19/2022  8:12 AM EST ----- Please let Mrs. Zellers know that even though she is not anemic, her iron levels remain quite low. She needs to continue PO ferrous sulfate '325mg'$  PO daily (or QOD if she can't do daily).   ----- Message ----- From: Buel Ream, Lab [facility] [place] Sent: 09/17/2022   2:13 PM EST To: Orson Slick, MD

## 2022-09-19 NOTE — Telephone Encounter (Signed)
TCT patient regarding recent lab results. No answer but was able to leave detailed message on her identified vm. Advised that even though she is not anemic, her iron levels remain quite low. She needs to continue PO ferrous sulfate '325mg'$  PO daily (or QOD if she can't do daily).  Advised we will see her in 6 months. Refill of po iron was sent in on 09/17/22

## 2022-10-14 ENCOUNTER — Encounter (HOSPITAL_BASED_OUTPATIENT_CLINIC_OR_DEPARTMENT_OTHER): Payer: Self-pay

## 2022-10-14 ENCOUNTER — Other Ambulatory Visit: Payer: Self-pay

## 2022-10-14 ENCOUNTER — Emergency Department (HOSPITAL_BASED_OUTPATIENT_CLINIC_OR_DEPARTMENT_OTHER): Payer: Managed Care, Other (non HMO)

## 2022-10-14 ENCOUNTER — Emergency Department (HOSPITAL_BASED_OUTPATIENT_CLINIC_OR_DEPARTMENT_OTHER)
Admission: EM | Admit: 2022-10-14 | Discharge: 2022-10-14 | Disposition: A | Discharge status: Home / Self Care | Payer: Managed Care, Other (non HMO) | Attending: Emergency Medicine | Admitting: Emergency Medicine

## 2022-10-14 DIAGNOSIS — J45909 Unspecified asthma, uncomplicated: Secondary | ICD-10-CM | POA: Insufficient documentation

## 2022-10-14 DIAGNOSIS — Z7951 Long term (current) use of inhaled steroids: Secondary | ICD-10-CM | POA: Insufficient documentation

## 2022-10-14 DIAGNOSIS — R319 Hematuria, unspecified: Secondary | ICD-10-CM | POA: Diagnosis not present

## 2022-10-14 DIAGNOSIS — R799 Abnormal finding of blood chemistry, unspecified: Secondary | ICD-10-CM | POA: Diagnosis not present

## 2022-10-14 DIAGNOSIS — R109 Unspecified abdominal pain: Secondary | ICD-10-CM | POA: Diagnosis present

## 2022-10-14 LAB — COMPREHENSIVE METABOLIC PANEL
ALT: 18 U/L (ref 0–44)
AST: 21 U/L (ref 15–41)
Albumin: 4 g/dL (ref 3.5–5.0)
Alkaline Phosphatase: 93 U/L (ref 38–126)
Anion gap: 9 (ref 5–15)
BUN: 12 mg/dL (ref 6–20)
CO2: 23 mmol/L (ref 22–32)
Calcium: 8.8 mg/dL — ABNORMAL LOW (ref 8.9–10.3)
Chloride: 101 mmol/L (ref 98–111)
Creatinine, Ser: 0.87 mg/dL (ref 0.44–1.00)
GFR, Estimated: >60 mL/min (ref >60)
Glucose, Bld: 95 mg/dL (ref 70–99)
Potassium: 3.9 mmol/L (ref 3.5–5.1)
Sodium: 133 mmol/L — ABNORMAL LOW (ref 135–145)
Total Bilirubin: 0.8 mg/dL (ref 0.3–1.2)
Total Protein: 7.7 g/dL (ref 6.5–8.1)

## 2022-10-14 LAB — CBC
HCT: 41.1 % (ref 36.0–46.0)
Hemoglobin: 12.9 g/dL (ref 12.0–15.0)
MCH: 27.9 pg (ref 26.0–34.0)
MCHC: 31.4 g/dL (ref 30.0–36.0)
MCV: 89 fL (ref 80.0–100.0)
Platelets: 295 10*3/uL (ref 150–400)
RBC: 4.62 MIL/uL (ref 3.87–5.11)
RDW: 13.2 % (ref 11.5–15.5)
WBC: 7.7 10*3/uL (ref 4.0–10.5)
nRBC: 0 % (ref 0.0–0.2)

## 2022-10-14 LAB — URINALYSIS, ROUTINE W REFLEX MICROSCOPIC
Bilirubin Urine: NEGATIVE
Glucose, UA: NEGATIVE mg/dL
Hgb urine dipstick: NEGATIVE
Ketones, ur: NEGATIVE mg/dL
Leukocytes,Ua: NEGATIVE
Nitrite: NEGATIVE
Protein, ur: NEGATIVE mg/dL
Specific Gravity, Urine: 1.025 (ref 1.005–1.030)
pH: 5.5 (ref 5.0–8.0)

## 2022-10-14 LAB — LIPASE, BLOOD: Lipase: 51 U/L (ref 11–51)

## 2022-10-14 MED ORDER — ACETAMINOPHEN 500 MG PO TABS
1000.0000 mg | ORAL_TABLET | Freq: Once | ORAL | Status: AC
  Administered 2022-10-14: 1000 mg via ORAL
  Filled 2022-10-14: qty 2

## 2022-10-14 MED ORDER — ONDANSETRON HCL 8 MG PO TABS: 8.0000 mg | ORAL_TABLET | Freq: Three times a day (TID) | ORAL | 0 refills | Status: AC | PRN

## 2022-10-14 MED ORDER — ONDANSETRON 4 MG PO TBDP
4.0000 mg | ORAL_TABLET | Freq: Once | ORAL | Status: AC
  Administered 2022-10-14: 4 mg via ORAL
  Filled 2022-10-14: qty 1

## 2022-10-14 NOTE — ED Provider Notes (Signed)
[facility] EMERGENCY DEPARTMENT Provider Note   CSN: [id number] Arrival date & time: 10/14/22  1640     History  Chief Complaint  Patient presents with   Abnormal Lab    LEMOYNE SCARPATI is a 37 y.o. female.  2-3 weeks ago her kidney function was high and they were saying something about dialysis.  Anemia all her life Intermittnet bladder infection.  Left sided flank pain. Cannot lay on that side. Hurts to touch. Hurting for months, worse over time. Has been worse since she had her hysterectomy.  Urinate a lot    Abnormal Lab      Home Medications Prior to Admission medications   Medication Sig Start Date End Date Taking? Authorizing Provider  albuterol (VENTOLIN HFA) 108 (90 Base) MCG/ACT inhaler 1-2 puffs every 6 (six) hours as needed. 06/12/21   [provider]  ascorbic acid (VITAMIN C) 250 MG CHEW Chew 250 mg by mouth daily.    [provider]  azelastine (ASTELIN) 0.1 % nasal spray  12/08/19   [provider]  cetirizine (ZYRTEC) 10 MG tablet Take 1 tablet (10 mg total) by mouth daily as needed for allergies. 09/18/22   Orson Slick, MD  cholecalciferol (VITAMIN D3) 25 MCG (1000 UNIT) tablet 1 tablet    [provider]  diltiazem (CARDIZEM) 120 MG tablet Take 120 mg by mouth 4 (four) times daily. As need for elevated BP & HR    [provider]  ferrous sulfate 325 (65 FE) MG tablet Take 1 tablet (325 mg total) by mouth daily with breakfast. Please take with a source of Vitamin C 09/17/22   Orson Slick, MD  ibuprofen (ADVIL) 600 MG tablet Take 1 tablet (600 mg total) by mouth every 6 (six) hours as needed. 02/15/22   Servando Salina, MD  magnesium oxide (MAG-OX) 400 MG tablet Take by mouth. 07/06/20   [provider]  Misc. Devices MISC Pt uses CPAP every night. 02/15/19   [provider]  ondansetron (ZOFRAN) 8 MG tablet Take 1 tablet (8 mg total) by mouth every 8 (eight) hours as needed for  nausea or vomiting. 09/18/22   Orson Slick, MD  SYNTHROID 175 MCG tablet Take 1 tablet (175 mcg total) by mouth daily. 09/18/22   Orson Slick, MD  traMADol (ULTRAM) 50 MG tablet Take 1 tablet (50 mg total) by mouth every 6 (six) hours as needed. 02/18/22   Drenda Freeze, MD  traZODone (DESYREL) 50 MG tablet 1 tablet at bedtime as needed 12/13/19   [provider]  valACYclovir (VALTREX) 500 MG tablet Take 500 mg by mouth daily as needed (outbreaks).    [provider]      Allergies    Blue dyes (parenteral)    Review of Systems   Review of Systems  Physical Exam Updated Vital Signs BP 99/82 (BP Location: Left Arm)   Pulse 72   Temp 98.4 F (36.9 C) (Oral)   Resp 16   Ht '5\' 8"'$  (1.727 m)   Wt (!) 142.9 kg   LMP 11/12/2021 (Exact Date)   SpO2 98%   BMI 47.90 kg/m  Physical Exam  ED Results / Procedures / Treatments   Labs (all labs ordered are listed, but only abnormal results are displayed) Labs Reviewed  COMPREHENSIVE METABOLIC PANEL - Abnormal; Notable for the following components:      Result Value   Sodium 133 (*)  Calcium 8.8 (*)    All other components within normal limits  URINALYSIS, ROUTINE W REFLEX MICROSCOPIC - Abnormal; Notable for the following components:   APPearance HAZY (*)    All other components within normal limits  LIPASE, BLOOD  CBC    EKG None  Radiology No results found.  Procedures Procedures  {Document cardiac monitor, telemetry assessment procedure when appropriate:1}  Medications Ordered in ED Medications - No data to display  ED Course/ Medical Decision Making/ A&P                           Medical Decision Making Amount and/or Complexity of Data Reviewed Labs: ordered.   ***  {Document critical care time when appropriate:1} {Document review of labs and clinical decision tools ie heart score, Chads2Vasc2 etc:1}  {Document your independent review of radiology images, and any outside  records:1} {Document your discussion with family members, caretakers, and with consultants:1} {Document social determinants of health affecting pt's care:1} {Document your decision making why or why not admission, treatments were needed:1} Final Clinical Impression(s) / ED Diagnoses Final diagnoses:  None    Rx / DC Orders ED Discharge Orders     None

## 2022-10-14 NOTE — ED Triage Notes (Signed)
Pt reports left sided flank and abdominal pain, fatigue, nausea, & urinary frequency. These symptoms started after her hysterectomy last year and has been getting worse. Her PCP has told her that her kidney function is elevated and has has conversations about needing dialysis.

## 2022-10-14 NOTE — Discharge Instructions (Signed)
You were seen in the emergency department today for left-sided flank pain.  Your labs are all reassuring.  We also did a CAT scan of your abdomen which did not show any abnormalities.  Please follow-up with your primary care provider for ongoing workup of your symptoms.  Please return to emergency department for significantly worsening symptoms.  We have sent a prescription of Zofran for you which you can use every 8 hours for nausea.

## 2022-11-09 ENCOUNTER — Other Ambulatory Visit: Payer: Self-pay | Admitting: Hematology and Oncology

## 2023-02-11 ENCOUNTER — Telehealth: Payer: Self-pay | Admitting: Hematology and Oncology

## 2023-03-10 ENCOUNTER — Other Ambulatory Visit: Payer: Self-pay | Admitting: Hematology and Oncology

## 2023-03-19 ENCOUNTER — Other Ambulatory Visit: Payer: Managed Care, Other (non HMO)

## 2023-03-19 ENCOUNTER — Ambulatory Visit: Payer: Managed Care, Other (non HMO) | Admitting: Hematology and Oncology

## 2023-06-13 ENCOUNTER — Ambulatory Visit
Admission: EM | Admit: 2023-06-13 | Discharge: 2023-06-13 | Disposition: A | Discharge status: Home / Self Care | Payer: Managed Care, Other (non HMO) | Attending: Internal Medicine | Admitting: Internal Medicine

## 2023-06-13 DIAGNOSIS — Z1152 Encounter for screening for COVID-19: Secondary | ICD-10-CM | POA: Insufficient documentation

## 2023-06-13 DIAGNOSIS — Z20822 Contact with and (suspected) exposure to covid-19: Secondary | ICD-10-CM | POA: Insufficient documentation

## 2023-06-13 NOTE — Discharge Instructions (Addendum)
We will notify you of your test results as they arrive and may take between about 24 hours.  I encourage you to sign up for MyChart if you have not already done so as this can be the easiest way for [place] to communicate results to you online or through a phone app.  Generally, we only contact you if it is a positive test result.  In the meantime, if you develop worsening symptoms including fever, chest pain, shortness of breath despite our current treatment plan then please report to the emergency room as this may be a sign of worsening status from possible viral infection.  Otherwise, we will manage this as a viral syndrome. For sore throat or cough try using a honey-based tea. Use 3 teaspoons of honey with juice squeezed from half lemon. Place shaved pieces of ginger into 1/2-1 cup of water and warm over stove top. Then mix the ingredients and repeat every 4 hours as needed. Please take Tylenol '500mg'$ -'650mg'$  every 6 hours for aches and pains, fevers. Hydrate very well with at least 2 liters of water. Eat light meals such as soups to replenish electrolytes and soft fruits, veggies. Start an antihistamine like Zyrtec ('10mg'$  daily) for postnasal drainage, sinus congestion.  You can take this together with pseudoephedrine (Sudafed) at a dose of 30 mg 2-3 times a day as needed for the same kind of congestion.
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
## 2023-06-13 NOTE — ED Triage Notes (Signed)
Pt reports +covid exposure last week-denies sx- requesting covid test-NAD-steady gait

## 2023-06-14 LAB — SARS CORONAVIRUS 2 (TAT 6-24 HRS): SARS Coronavirus 2: NEGATIVE

## 2023-06-22 ENCOUNTER — Emergency Department (HOSPITAL_BASED_OUTPATIENT_CLINIC_OR_DEPARTMENT_OTHER)
Admission: EM | Admit: 2023-06-22 | Discharge: 2023-06-22 | Disposition: A | Discharge status: Home / Self Care | Payer: Managed Care, Other (non HMO) | Attending: Emergency Medicine | Admitting: Emergency Medicine

## 2023-06-22 ENCOUNTER — Encounter (HOSPITAL_BASED_OUTPATIENT_CLINIC_OR_DEPARTMENT_OTHER): Payer: Self-pay | Admitting: Emergency Medicine

## 2023-06-22 DIAGNOSIS — H6691 Otitis media, unspecified, right ear: Secondary | ICD-10-CM | POA: Insufficient documentation

## 2023-06-22 DIAGNOSIS — H9201 Otalgia, right ear: Secondary | ICD-10-CM | POA: Diagnosis present

## 2023-06-22 MED ORDER — AMOXICILLIN 500 MG PO CAPS: 500.0000 mg | ORAL_CAPSULE | Freq: Three times a day (TID) | ORAL | 0 refills | Status: AC

## 2023-06-22 MED ORDER — AMOXICILLIN 500 MG PO CAPS
500.0000 mg | ORAL_CAPSULE | Freq: Once | ORAL | Status: AC
  Administered 2023-06-22: 500 mg via ORAL
  Filled 2023-06-22: qty 1

## 2023-06-22 MED ORDER — FLUCONAZOLE 150 MG PO TABS: 150.0000 mg | ORAL_TABLET | Freq: Every day | ORAL | 1 refills | Status: AC

## 2023-06-22 NOTE — ED Triage Notes (Signed)
Right ear pain for a few days that worsened last night. Describes pain as "pins and needles" with "cloudy" hearing. Reports hx of recurrent ear infections since childhood. Denies sore throat, congestion, known sick exposure.

## 2023-06-22 NOTE — Discharge Instructions (Signed)
Return if any problems.

## 2023-06-22 NOTE — ED Notes (Signed)
D/c paperwork reviewed with pt, including prescriptions and follow up care.  All questions and/or concerns addressed at time of d/c.  No further needs expressed. . Pt verbalized understanding, Ambulatory without assistance to ED exit, NAD.   

## 2023-06-23 NOTE — ED Provider Notes (Signed)
[facility] Provider Note   CSN: [id number] Arrival date & time: 06/22/23  1936     History  Chief Complaint  Patient presents with   Ear Pain    Yvonne [name] is a 37 y.o. female.  Complains of pain in her right ear.  Patient reports she has a past medical history of ear infections.  Patient reports that she is followed by ENT.  Patient denies any other complaints she denies any cough or congestion.  Patient denies any fever or chills.  She has not had a sore throat she denies any exposure to anyone with any illness.  Patient reports her hearing is unchanged.  The history is provided by the patient. No language interpreter was used.       Home Medications Prior to Admission medications   Medication Sig Start Date End Date Taking? Authorizing Provider  amoxicillin (AMOXIL) 500 MG capsule Take 1 capsule (500 mg total) by mouth 3 (three) times daily. 06/22/23  Yes Cheron Schaumann K, PA-C  fluconazole (DIFLUCAN) 150 MG tablet Take 1 tablet (150 mg total) by mouth daily. 06/22/23  Yes Cheron Schaumann K, PA-C  albuterol (VENTOLIN HFA) 108 (90 Base) MCG/ACT inhaler 1-2 puffs every 6 (six) hours as needed. 06/12/21   [provider]  ascorbic acid (VITAMIN C) 250 MG CHEW Chew 250 mg by mouth daily.    [provider]  azelastine (ASTELIN) 0.1 % nasal spray  12/08/19   [provider]  cetirizine (ZYRTEC) 10 MG tablet TAKE 1 TABLET BY MOUTH ONCE DAILY AS NEEDED FOR ALLERGIES 11/13/22   Jaci Standard, MD  cholecalciferol (VITAMIN D3) 25 MCG (1000 UNIT) tablet 1 tablet    [provider]  diltiazem (CARDIZEM) 120 MG tablet Take 120 mg by mouth 4 (four) times daily. As need for elevated BP & HR    [provider]  ferrous sulfate 325 (65 FE) MG tablet Take 1 tablet (325 mg total) by mouth daily with breakfast. Please take with a source of Vitamin C 09/17/22   Jaci Standard, MD  ibuprofen (ADVIL) 600 MG  tablet Take 1 tablet (600 mg total) by mouth every 6 (six) hours as needed. 02/15/22   Maxie Better, MD  magnesium oxide (MAG-OX) 400 MG tablet Take by mouth. 07/06/20   [provider]  metoprolol succinate (TOPROL-XL) 25 MG 24 hr tablet Take by mouth. 04/03/22   [provider]  Misc. Devices MISC Pt uses CPAP every night. 02/15/19   [provider]  ondansetron (ZOFRAN) 8 MG tablet Take 1 tablet (8 mg total) by mouth every 8 (eight) hours as needed for nausea or vomiting. 10/14/22   Loetta Rough, MD  SYNTHROID 175 MCG tablet Take 1 tablet by mouth once daily 11/13/22   Jaci Standard, MD  traMADol (ULTRAM) 50 MG tablet Take 1 tablet (50 mg total) by mouth every 6 (six) hours as needed. 02/18/22   Charlynne Pander, MD  traZODone (DESYREL) 50 MG tablet 1 tablet at bedtime as needed 12/13/19   [provider]  valACYclovir (VALTREX) 500 MG tablet Take 500 mg by mouth daily as needed (outbreaks).    [provider]      Allergies    Blue dyes (parenteral) and Lisinopril    Review of Systems   Review of Systems  All other systems reviewed and are negative.   Physical Exam Updated Vital Signs BP 118/80  Pulse (!) 57   Temp 98.4 F (36.9 C) (Oral)   Resp 15   Ht 5\' 8"  (1.727 m)   Wt (!) 154.2 kg   LMP 11/12/2021 (Exact Date)   SpO2 99%   BMI 51.70 kg/m  Physical Exam Vitals and nursing note reviewed.  Constitutional:      Appearance: She is well-developed.  HENT:     Head: Normocephalic.     Ears:     Comments: Right TM erythematous, poor landmarks Eyes:     Pupils: Pupils are equal, round, and reactive to light.  Cardiovascular:     Rate and Rhythm: Normal rate.  Pulmonary:     Effort: Pulmonary effort is normal.  Abdominal:     General: There is no distension.  Musculoskeletal:        General: Normal range of motion.     Cervical back: Normal range of motion.  Skin:    General: Skin is warm.  Neurological:     General:  No focal deficit present.     Mental Status: She is alert and oriented to person, place, and time.  Psychiatric:        Mood and Affect: Mood normal.     ED Results / Procedures / Treatments   Labs (all labs ordered are listed, but only abnormal results are displayed) Labs Reviewed - No data to display  EKG None  Radiology No results found.  Procedures Procedures    Medications Ordered in ED Medications  amoxicillin (AMOXIL) capsule 500 mg (500 mg Oral Given 06/22/23 2221)    ED Course/ Medical Decision Making/ A&P                                 Medical Decision Making Risk Prescription drug management.           Final Clinical Impression(s) / ED Diagnoses Final diagnoses:  Right otitis media, unspecified otitis media type    Rx / DC Orders ED Discharge Orders          Ordered    amoxicillin (AMOXIL) 500 MG capsule  3 times daily        06/22/23 2210    fluconazole (DIFLUCAN) 150 MG tablet  Daily        06/22/23 2226           An After Visit Summary was printed and given to the patient.    Elson Areas, Cordelia Poche 06/23/23 2250    Maia Plan, MD 06/25/23 1228

## 2023-12-10 ENCOUNTER — Encounter: Payer: Self-pay | Admitting: Hematology and Oncology
